# Patient Record
Sex: Male | Born: 1960
Health system: Southern US, Community
[De-identification: ages and names within clinical notes are randomized; demographics above are authoritative.]

## PROBLEM LIST (undated history)

## (undated) DIAGNOSIS — M1990 Unspecified osteoarthritis, unspecified site: Secondary | ICD-10-CM

## (undated) DIAGNOSIS — I639 Cerebral infarction, unspecified: Secondary | ICD-10-CM

## (undated) HISTORY — DX: Unspecified osteoarthritis, unspecified site: M19.90

## (undated) HISTORY — DX: Cerebral infarction, unspecified: I63.9

## (undated) HISTORY — PX: OTHER SURGICAL HISTORY: SHX169

## (undated) HISTORY — PX: ACHILLES TENDON REPAIR: SUR1153

---

## 2017-08-15 ENCOUNTER — Ambulatory Visit: Payer: Self-pay | Admitting: Family Medicine

## 2017-08-22 ENCOUNTER — Encounter: Payer: Self-pay | Admitting: Family Medicine

## 2017-08-22 ENCOUNTER — Other Ambulatory Visit (INDEPENDENT_AMBULATORY_CARE_PROVIDER_SITE_OTHER): Payer: Commercial Managed Care - PPO

## 2017-08-22 ENCOUNTER — Encounter: Payer: Self-pay | Admitting: Gastroenterology

## 2017-08-22 ENCOUNTER — Ambulatory Visit (INDEPENDENT_AMBULATORY_CARE_PROVIDER_SITE_OTHER): Payer: Commercial Managed Care - PPO | Admitting: Family Medicine

## 2017-08-22 VITALS — BP 140/80 | HR 75 | Temp 98.5°F | Ht 71.0 in | Wt 171.6 lb

## 2017-08-22 DIAGNOSIS — N529 Male erectile dysfunction, unspecified: Secondary | ICD-10-CM

## 2017-08-22 DIAGNOSIS — Z0001 Encounter for general adult medical examination with abnormal findings: Secondary | ICD-10-CM

## 2017-08-22 DIAGNOSIS — Z0000 Encounter for general adult medical examination without abnormal findings: Secondary | ICD-10-CM

## 2017-08-22 DIAGNOSIS — Z125 Encounter for screening for malignant neoplasm of prostate: Secondary | ICD-10-CM | POA: Diagnosis not present

## 2017-08-22 DIAGNOSIS — D649 Anemia, unspecified: Secondary | ICD-10-CM

## 2017-08-22 LAB — CBC WITH DIFFERENTIAL/PLATELET
BASOS PCT: 1 % (ref 0.0–3.0)
Basophils Absolute: 0.1 10*3/uL (ref 0.0–0.1)
EOS PCT: 1.6 % (ref 0.0–5.0)
Eosinophils Absolute: 0.1 10*3/uL (ref 0.0–0.7)
HEMATOCRIT: 34.3 % — AB (ref 39.0–52.0)
HEMOGLOBIN: 10.4 g/dL — AB (ref 13.0–17.0)
LYMPHS PCT: 17.9 % (ref 12.0–46.0)
Lymphs Abs: 1.3 10*3/uL (ref 0.7–4.0)
MCHC: 30.2 g/dL (ref 30.0–36.0)
MCV: 70.8 fl — AB (ref 78.0–100.0)
MONOS PCT: 11.5 % (ref 3.0–12.0)
Monocytes Absolute: 0.8 10*3/uL (ref 0.1–1.0)
Neutro Abs: 4.9 10*3/uL (ref 1.4–7.7)
Neutrophils Relative %: 68 % (ref 43.0–77.0)
Platelets: 308 10*3/uL (ref 150.0–400.0)
RBC: 4.85 Mil/uL (ref 4.22–5.81)
RDW: 19.9 % — AB (ref 11.5–15.5)
WBC: 7.2 10*3/uL (ref 4.0–10.5)

## 2017-08-22 LAB — BASIC METABOLIC PANEL
BUN: 11 mg/dL (ref 6–23)
CO2: 31 meq/L (ref 19–32)
Calcium: 9.2 mg/dL (ref 8.4–10.5)
Chloride: 100 mEq/L (ref 96–112)
Creatinine, Ser: 0.92 mg/dL (ref 0.40–1.50)
GFR: 109.3 mL/min (ref >60.00)
GLUCOSE: 94 mg/dL (ref 70–99)
POTASSIUM: 3.5 meq/L (ref 3.5–5.1)
SODIUM: 137 meq/L (ref 135–145)

## 2017-08-22 LAB — POC URINALSYSI DIPSTICK (AUTOMATED)
Bilirubin, UA: NEGATIVE
Blood, UA: NEGATIVE
GLUCOSE UA: NEGATIVE
Ketones, UA: NEGATIVE
LEUKOCYTES UA: NEGATIVE
Nitrite, UA: NEGATIVE
SPEC GRAV UA: 1.025 (ref 1.010–1.025)
UROBILINOGEN UA: 0.2 U/dL
pH, UA: 6 (ref 5.0–8.0)

## 2017-08-22 LAB — HEPATIC FUNCTION PANEL
ALK PHOS: 72 U/L (ref 39–117)
ALT: 13 U/L (ref 0–53)
AST: 19 U/L (ref 0–37)
Albumin: 4.3 g/dL (ref 3.5–5.2)
BILIRUBIN DIRECT: 0.1 mg/dL (ref 0.0–0.3)
BILIRUBIN TOTAL: 0.5 mg/dL (ref 0.2–1.2)
TOTAL PROTEIN: 7.8 g/dL (ref 6.0–8.3)

## 2017-08-22 LAB — LIPID PANEL
CHOLESTEROL: 204 mg/dL — AB (ref 0–200)
HDL: 61.6 mg/dL (ref >39.00)
LDL CALC: 122 mg/dL — AB (ref 0–99)
NONHDL: 142.19
Total CHOL/HDL Ratio: 3
Triglycerides: 99 mg/dL (ref 0.0–149.0)
VLDL: 19.8 mg/dL (ref 0.0–40.0)

## 2017-08-22 LAB — FOLATE: Folate: 8.4 ng/mL (ref >5.9)

## 2017-08-22 LAB — VITAMIN B12: Vitamin B-12: 289 pg/mL (ref 211–911)

## 2017-08-22 LAB — IRON: IRON: 18 ug/dL — AB (ref 42–165)

## 2017-08-22 LAB — FERRITIN: FERRITIN: 7.4 ng/mL — AB (ref 22.0–322.0)

## 2017-08-22 LAB — TSH: TSH: 3.42 u[IU]/mL (ref 0.35–4.50)

## 2017-08-22 LAB — PSA: PSA: 1 ng/mL (ref 0.10–4.00)

## 2017-08-22 MED ORDER — SILDENAFIL CITRATE 100 MG PO TABS: 100.0000 mg | ORAL_TABLET | ORAL | 11 refills | Status: DC | PRN

## 2017-08-22 NOTE — Addendum Note (Signed)
Addended by: Charna ElizabethLEMMONS, Makylah Bossard L on: 08/22/2017 04:46 PM   Modules accepted: Orders

## 2017-08-22 NOTE — Addendum Note (Signed)
Addended by: Gershon CraneFRY, Taelar Gronewold A on: 08/22/2017 04:46 PM   Modules accepted: Orders

## 2017-08-22 NOTE — Progress Notes (Signed)
   Subjective:    Patient ID: Theodore Schaefer, male    DOB: November 04, 1960, 57 y.o.   MRN: [id number]  HPI 57 yr old male to establish and for a well exam. He moved to [place] about [place] ago [place] to [place] near his daughter. He feels great in general but he does ask about help with erections. He is engaged to be married. He operates a [employer] on his job.    Review of Systems  Constitutional: Negative.   HENT: Negative.   Eyes: Negative.   Respiratory: Negative.   Cardiovascular: Negative.   Gastrointestinal: Negative.   Genitourinary: Negative.   Musculoskeletal: Negative.   Skin: Negative.   Neurological: Negative.   Psychiatric/Behavioral: Negative.        Objective:   Physical Exam  Constitutional: He is oriented to person, place, and time. He appears well-developed and well-nourished. No distress.  HENT:  Head: Normocephalic and atraumatic.  Right Ear: External ear normal.  Left Ear: External ear normal.  Nose: Nose normal.  Mouth/Throat: Oropharynx is clear and moist. No oropharyngeal exudate.  Eyes: Conjunctivae and EOM are normal. Pupils are equal, round, and reactive to light. Right eye exhibits no discharge. Left eye exhibits no discharge. No scleral icterus.  Neck: Neck supple. No JVD present. No tracheal deviation present. No thyromegaly present.  Cardiovascular: Normal rate, regular rhythm, normal heart sounds and intact distal pulses. Exam reveals no gallop and no friction rub.  No murmur heard. Pulmonary/Chest: Effort normal and breath sounds normal. No respiratory distress. He has no wheezes. He has no rales. He exhibits no tenderness.  Abdominal: Soft. Bowel sounds are normal. He exhibits no distension and no mass. There is no tenderness. There is no rebound and no guarding.  Genitourinary: Rectum normal, prostate normal and penis normal. Rectal exam shows guaiac negative stool. No penile tenderness.  Musculoskeletal: Normal range of motion. He  exhibits no edema or tenderness.  Lymphadenopathy:    He has no cervical adenopathy.  Neurological: He is alert and oriented to person, place, and time. He has normal reflexes. No cranial nerve deficit. He exhibits normal muscle tone. Coordination normal.  Skin: Skin is warm and dry. No rash noted. He is not diaphoretic. No erythema. No pallor.  Psychiatric: He has a normal mood and affect. His behavior is normal. Judgment and thought content normal.          Assessment & Plan:  Intro visit and well exam. We discussed diet and exercise. Get fasting labs today. Try Viagra for the ED. Set up his first colonoscopy.  Gershon CraneStephen Fry, MD

## 2017-09-01 ENCOUNTER — Telehealth: Payer: Self-pay | Admitting: Family Medicine

## 2017-09-01 MED ORDER — FERROUS SULFATE 325 (65 FE) MG PO TABS: 325.0000 mg | ORAL_TABLET | Freq: Two times a day (BID) | ORAL | 11 refills | Status: DC

## 2017-09-01 NOTE — Telephone Encounter (Signed)
Called pt stated that I aplogize for the delay we just needed to know what pharmacy he wanted to use. Pt wanted [facility] on [address]. Rx has been sent for ferrous sulfate 325 mg to take ferrous sulfate 325 mg to take bid, #60 with 11 rf.   Called [facility] in regards to the Viagra they stated that the pt's insurance will not cover. Pt can try [facility] or call their insurance and see what the will cover if not we can do a PA.   Spoke with pt. Pt is aware will try [facility] first.

## 2017-09-01 NOTE — Telephone Encounter (Signed)
Copied from CRM [id number]. Topic: Quick Communication - See Telephone Encounter >> Sep 01, 2017 11:00 AM Windy KalataMichael, Nasiyah Laverdiere L, NT wrote: CRM for notification. See Telephone encounter for:  09/01/17.  Patient states he cannot pick up his Sildenafil until Dr. Clent RidgesFry authorizes for his insurance company. Patient state she was supposed to have a RX for iron pills due to low iron. Please advise. Patient is requesting Dr. Clent RidgesFry nurse give him a call back.  [facility].

## 2017-09-05 ENCOUNTER — Telehealth: Payer: Self-pay | Admitting: *Deleted

## 2017-09-05 NOTE — Telephone Encounter (Signed)
Copied from CRM [id number]. Topic: General - Other >> Sep 05, 2017  8:34 AM Gerrianne ScalePayne, Angela L wrote: Reason for CRM: patient calling stating that he was suppose to leave Dr Clent RidgesFry his insurance number for his sildenafil (VIAGRA) 100 MG tablet [facility] [phone]

## 2017-09-08 NOTE — Telephone Encounter (Signed)
I do not understand this message. I never asked for his insurance number. Did we need this for a prior authorization?

## 2017-09-08 NOTE — Telephone Encounter (Signed)
Prior auth sent to [email or web address] and approval was given.  I called [facility], informed Lurena JoinerRebecca of this and she stated she sent this through and will inform the pt.

## 2017-09-08 NOTE — Telephone Encounter (Signed)
CaseId:[id number];Status:Approved;Review Type:Prior Auth;Coverage Start Date:08/09/2017;Coverage End Date:09/08/2018.

## 2017-09-08 NOTE — Telephone Encounter (Signed)
Pt states he needs a prior auth on the refill for his sildenafil (VIAGRA) 100 MG tablet  Pt states he spoke with insurance comapany and they are waiting for us to call for prior auth.

## 2017-09-08 NOTE — Telephone Encounter (Signed)
Sent to PCP ?

## 2017-09-11 NOTE — Telephone Encounter (Signed)
Pt is going to call pharmacy to see if it is ready, script was sent in this month.

## 2017-09-11 NOTE — Telephone Encounter (Signed)
Patient called in again for his Viagra medication.  [facility] [address]

## 2017-09-26 ENCOUNTER — Other Ambulatory Visit: Payer: Self-pay

## 2017-09-26 ENCOUNTER — Ambulatory Visit (AMBULATORY_SURGERY_CENTER): Payer: Self-pay

## 2017-09-26 VITALS — Ht 72.0 in | Wt 170.0 lb

## 2017-09-26 DIAGNOSIS — Z1211 Encounter for screening for malignant neoplasm of colon: Secondary | ICD-10-CM

## 2017-09-26 MED ORDER — NA SULFATE-K SULFATE-MG SULF 17.5-3.13-1.6 GM/177ML PO SOLN: 1.0000 | Freq: Once | ORAL | 0 refills | Status: AC

## 2017-09-26 NOTE — Progress Notes (Signed)
No egg or soy allergy known to patient  No issues with past sedation with any surgeries  or procedures, no intubation problems  No diet pills per patient No home 02 use per patient  No blood thinners per patient  Pt denies issues with constipation  No A fib or A flutter  EMMI video sent to pt's e mail pt declined   

## 2017-09-30 ENCOUNTER — Encounter: Payer: Self-pay | Admitting: Gastroenterology

## 2017-10-10 ENCOUNTER — Encounter: Payer: Commercial Managed Care - PPO | Admitting: Gastroenterology

## 2017-10-13 ENCOUNTER — Ambulatory Visit (AMBULATORY_SURGERY_CENTER): Payer: Commercial Managed Care - PPO | Admitting: Gastroenterology

## 2017-10-13 ENCOUNTER — Other Ambulatory Visit: Payer: Self-pay

## 2017-10-13 ENCOUNTER — Encounter: Payer: Self-pay | Admitting: Gastroenterology

## 2017-10-13 VITALS — BP 117/75 | HR 62 | Temp 98.6°F | Resp 11 | Ht 72.0 in | Wt 170.0 lb

## 2017-10-13 DIAGNOSIS — Z1212 Encounter for screening for malignant neoplasm of rectum: Secondary | ICD-10-CM

## 2017-10-13 DIAGNOSIS — Z1211 Encounter for screening for malignant neoplasm of colon: Secondary | ICD-10-CM | POA: Diagnosis present

## 2017-10-13 HISTORY — PX: COLONOSCOPY: SHX174

## 2017-10-13 MED ORDER — SODIUM CHLORIDE 0.9 % IV SOLN: 500.0000 mL | Freq: Once | INTRAVENOUS | Status: DC

## 2017-10-13 NOTE — Progress Notes (Signed)
No changes in medical or surgical hx since PV per pt 

## 2017-10-13 NOTE — Op Note (Signed)
[facility] Patient Name: Theodore Schaefer Procedure Date: 10/13/2017 9:05 AM MRN: [id number] Endoscopist: Viviann SpareSteven P. Mesha Schamberger MD, MD Age: 57 Referring MD:  Date of Birth: 1961/02/28 Gender: Male Account #: [id number] Procedure:                Colonoscopy Indications:              Screening for colorectal malignant neoplasm, This                            is the patient's first colonoscopy Medicines:                Monitored Anesthesia Care Procedure:                Pre-Anesthesia Assessment:                           - Prior to the procedure, a History and Physical                            was performed, and patient medications and                            allergies were reviewed. The patient's tolerance of                            previous anesthesia was also reviewed. The risks                            and benefits of the procedure and the sedation                            options and risks were discussed with the patient.                            All questions were answered, and informed consent                            was obtained. Prior Anticoagulants: The patient has                            taken no previous anticoagulant or antiplatelet                            agents. ASA Grade Assessment: I - A normal, healthy                            patient. After reviewing the risks and benefits,                            the patient was deemed in satisfactory condition to                            undergo the procedure.  After obtaining informed consent, the colonoscope                            was passed under direct vision. Throughout the                            procedure, the patient's blood pressure, pulse, and                            oxygen saturations were monitored continuously. The                            Colonoscope was introduced through the anus and                            advanced to the the cecum,  identified by                            appendiceal orifice and ileocecal valve. The                            colonoscopy was performed without difficulty. The                            patient tolerated the procedure well. The quality                            of the bowel preparation was good. The ileocecal                            valve, appendiceal orifice, and rectum were                            photographed. Scope In: 9:08:27 AM Scope Out: 9:21:58 AM Scope Withdrawal Time: 0 hours 11 minutes 27 seconds  Total Procedure Duration: 0 hours 13 minutes 31 seconds  Findings:                 The perianal and digital rectal examinations were                            normal.                           Internal hemorrhoids were found during                            retroflexion. The hemorrhoids were small.                           The exam was otherwise without abnormality. No                            polyps Complications:            No immediate complications. Estimated blood loss:  None. Estimated Blood Loss:     Estimated blood loss: none. Impression:               - Internal hemorrhoids.                           - The examination was otherwise normal.                           - No polyps Recommendation:           - Patient has a contact number available for                            emergencies. The signs and symptoms of potential                            delayed complications were discussed with the                            patient. Return to normal activities tomorrow.                            Written discharge instructions were provided to the                            patient.                           - Resume previous diet.                           - Continue present medications.                           - Repeat colonoscopy in 10 years for screening                            purposes. Viviann Spare P. Blayden Conwell MD, MD 10/13/2017  9:25:43 AM This report has been signed electronically.

## 2017-10-13 NOTE — Patient Instructions (Signed)
Discharge instructions given. Handout on hemorrhoids. Resume previous medications. YOU HAD AN ENDOSCOPIC PROCEDURE TODAY AT THE [facility]:   Refer to the procedure report that was given to you for any specific questions about what was found during the examination.  If the procedure report does not answer your questions, please call your gastroenterologist to clarify.  If you requested that your care partner not be given the details of your procedure findings, then the procedure report has been included in a sealed envelope for you to review at your convenience later.  YOU SHOULD EXPECT: Some feelings of bloating in the abdomen. Passage of more gas than usual.  Walking can help get rid of the air that was put into your GI tract during the procedure and reduce the bloating. If you had a lower endoscopy (such as a colonoscopy or flexible sigmoidoscopy) you may notice spotting of blood in your stool or on the toilet paper. If you underwent a bowel prep for your procedure, you may not have a normal bowel movement for a few days.  Please Note:  You might notice some irritation and congestion in your nose or some drainage.  This is from the oxygen used during your procedure.  There is no need for concern and it should clear up in a day or so.  SYMPTOMS TO REPORT IMMEDIATELY:   Following lower endoscopy (colonoscopy or flexible sigmoidoscopy):  Excessive amounts of blood in the stool  Significant tenderness or worsening of abdominal pains  Swelling of the abdomen that is new, acute  Fever of 100F or higher   For urgent or emergent issues, a gastroenterologist can be reached at any hour by calling [phone].   DIET:  We do recommend a small meal at first, but then you may proceed to your regular diet.  Drink plenty of fluids but you should avoid alcoholic beverages for 24 hours.  ACTIVITY:  You should plan to take it easy for the rest of today and you should NOT DRIVE or use heavy  machinery until tomorrow (because of the sedation medicines used during the test).    FOLLOW UP: Our staff will call the number listed on your records the next business day following your procedure to check on you and address any questions or concerns that you may have regarding the information given to you following your procedure. If we do not reach you, we will leave a message.  However, if you are feeling well and you are not experiencing any problems, there is no need to return our call.  We will assume that you have returned to your regular daily activities without incident.  If any biopsies were taken you will be contacted by phone or by letter within the next 1-3 weeks.  Please call us at [phone] if you have not heard about the biopsies in 3 weeks.    SIGNATURES/CONFIDENTIALITY: You and/or your care partner have signed paperwork which will be entered into your electronic medical record.  These signatures attest to the fact that that the information above on your After Visit Summary has been reviewed and is understood.  Full responsibility of the confidentiality of this discharge information lies with you and/or your care-partner. 

## 2017-10-13 NOTE — Progress Notes (Signed)
Report given to PACU, vss 

## 2017-10-14 ENCOUNTER — Telehealth: Payer: Self-pay

## 2017-10-14 NOTE — Telephone Encounter (Signed)
  Follow up Call-  Call back number 10/13/2017  Post procedure Call Back phone  # [phone]  Permission to leave phone message Yes     Patient questions:  Do you have a fever, pain , or abdominal swelling? No. Pain Score  0 *  Have you tolerated food without any problems? Yes.    Have you been able to return to your normal activities? Yes.    Do you have any questions about your discharge instructions: Diet   No. Medications  No. Follow up visit  No.  Do you have questions or concerns about your Care? No.  Actions: * If pain score is 4 or above: No action needed, pain <4.

## 2017-11-11 ENCOUNTER — Ambulatory Visit (INDEPENDENT_AMBULATORY_CARE_PROVIDER_SITE_OTHER): Payer: Commercial Managed Care - PPO | Admitting: Family Medicine

## 2017-11-11 ENCOUNTER — Encounter: Payer: Self-pay | Admitting: Family Medicine

## 2017-11-11 VITALS — BP 130/76 | HR 63 | Temp 98.3°F | Ht 72.0 in | Wt 171.4 lb

## 2017-11-11 DIAGNOSIS — D509 Iron deficiency anemia, unspecified: Secondary | ICD-10-CM | POA: Diagnosis not present

## 2017-11-11 LAB — CBC WITH DIFFERENTIAL/PLATELET
BASOS ABS: 0.1 10*3/uL (ref 0.0–0.1)
Basophils Relative: 1 % (ref 0.0–3.0)
Eosinophils Absolute: 0.1 10*3/uL (ref 0.0–0.7)
Eosinophils Relative: 1.4 % (ref 0.0–5.0)
HEMATOCRIT: 40.6 % (ref 39.0–52.0)
Hemoglobin: 13.1 g/dL (ref 13.0–17.0)
LYMPHS ABS: 1.4 10*3/uL (ref 0.7–4.0)
LYMPHS PCT: 19 % (ref 12.0–46.0)
MCHC: 32.3 g/dL (ref 30.0–36.0)
MCV: 75.4 fl — AB (ref 78.0–100.0)
Monocytes Absolute: 0.4 10*3/uL (ref 0.1–1.0)
Monocytes Relative: 5 % (ref 3.0–12.0)
NEUTROS PCT: 73.6 % (ref 43.0–77.0)
Neutro Abs: 5.2 10*3/uL (ref 1.4–7.7)
Platelets: 244 10*3/uL (ref 150.0–400.0)
RBC: 5.38 Mil/uL (ref 4.22–5.81)
RDW: 21.5 % — ABNORMAL HIGH (ref 11.5–15.5)
WBC: 7.1 10*3/uL (ref 4.0–10.5)

## 2017-11-11 LAB — IRON: Iron: 96 ug/dL (ref 42–165)

## 2017-11-11 LAB — FERRITIN: Ferritin: 21.7 ng/mL — ABNORMAL LOW (ref 22.0–322.0)

## 2017-11-11 NOTE — Progress Notes (Signed)
   Subjective:    Patient ID: Theodore Schaefer, male    DOB: 1960-12-07, 57 y.o.   MRN: [id number]  HPI Here to follow up a recent diagnosis of iron deficiency anemia. He was here for a well exam in January and his Hgb was low at 10.4. Iron was low at 18 and ferritin was 7.4. Since then he had an unremarkable colonoscopy. He has been taking iron supplements bid and is tolerating these well.    Review of Systems  Constitutional: Negative.   Respiratory: Negative.   Cardiovascular: Negative.   Gastrointestinal: Negative.        Objective:   Physical Exam  Constitutional: He is oriented to person, place, and time. He appears well-developed and well-nourished.  Cardiovascular: Normal rate, regular rhythm, normal heart sounds and intact distal pulses.  Pulmonary/Chest: Effort normal and breath sounds normal. No respiratory distress. He has no wheezes. He has no rales.  Neurological: He is alert and oriented to person, place, and time.          Assessment & Plan:  Anemia, we will repeat labs today.  Gershon CraneStephen Deztinee Lohmeyer, MD

## 2018-08-25 ENCOUNTER — Other Ambulatory Visit: Payer: Self-pay

## 2018-08-25 ENCOUNTER — Emergency Department (HOSPITAL_COMMUNITY): Payer: Self-pay

## 2018-08-25 ENCOUNTER — Inpatient Hospital Stay (HOSPITAL_COMMUNITY)
Admission: EM | Admit: 2018-08-25 | Discharge: 2018-08-27 | DRG: 065 | Disposition: A | Discharge status: Home / Self Care | Payer: Self-pay | Attending: Internal Medicine | Admitting: Internal Medicine

## 2018-08-25 ENCOUNTER — Encounter (HOSPITAL_COMMUNITY): Payer: Self-pay

## 2018-08-25 DIAGNOSIS — R29700 NIHSS score 0: Secondary | ICD-10-CM | POA: Diagnosis present

## 2018-08-25 DIAGNOSIS — F17210 Nicotine dependence, cigarettes, uncomplicated: Secondary | ICD-10-CM | POA: Diagnosis present

## 2018-08-25 DIAGNOSIS — K648 Other hemorrhoids: Secondary | ICD-10-CM | POA: Diagnosis present

## 2018-08-25 DIAGNOSIS — R030 Elevated blood-pressure reading, without diagnosis of hypertension: Secondary | ICD-10-CM

## 2018-08-25 DIAGNOSIS — Z79899 Other long term (current) drug therapy: Secondary | ICD-10-CM

## 2018-08-25 DIAGNOSIS — E785 Hyperlipidemia, unspecified: Secondary | ICD-10-CM | POA: Diagnosis present

## 2018-08-25 DIAGNOSIS — D649 Anemia, unspecified: Secondary | ICD-10-CM

## 2018-08-25 DIAGNOSIS — F1210 Cannabis abuse, uncomplicated: Secondary | ICD-10-CM | POA: Diagnosis present

## 2018-08-25 DIAGNOSIS — I10 Essential (primary) hypertension: Secondary | ICD-10-CM | POA: Diagnosis present

## 2018-08-25 DIAGNOSIS — Z720 Tobacco use: Secondary | ICD-10-CM

## 2018-08-25 DIAGNOSIS — R202 Paresthesia of skin: Secondary | ICD-10-CM | POA: Diagnosis present

## 2018-08-25 DIAGNOSIS — G8194 Hemiplegia, unspecified affecting left nondominant side: Secondary | ICD-10-CM | POA: Diagnosis present

## 2018-08-25 DIAGNOSIS — D509 Iron deficiency anemia, unspecified: Secondary | ICD-10-CM | POA: Diagnosis present

## 2018-08-25 DIAGNOSIS — E119 Type 2 diabetes mellitus without complications: Secondary | ICD-10-CM | POA: Diagnosis present

## 2018-08-25 DIAGNOSIS — I639 Cerebral infarction, unspecified: Secondary | ICD-10-CM | POA: Diagnosis present

## 2018-08-25 DIAGNOSIS — I6381 Other cerebral infarction due to occlusion or stenosis of small artery: Principal | ICD-10-CM | POA: Diagnosis present

## 2018-08-25 DIAGNOSIS — Z8249 Family history of ischemic heart disease and other diseases of the circulatory system: Secondary | ICD-10-CM

## 2018-08-25 DIAGNOSIS — Z8673 Personal history of transient ischemic attack (TIA), and cerebral infarction without residual deficits: Secondary | ICD-10-CM

## 2018-08-25 DIAGNOSIS — Z716 Tobacco abuse counseling: Secondary | ICD-10-CM

## 2018-08-25 LAB — CBC WITH DIFFERENTIAL/PLATELET
Abs Immature Granulocytes: 0.02 10*3/uL (ref 0.00–0.07)
Basophils Absolute: 0.1 10*3/uL (ref 0.0–0.1)
Basophils Relative: 1 %
EOS ABS: 0.1 10*3/uL (ref 0.0–0.5)
Eosinophils Relative: 1 %
HEMATOCRIT: 40.2 % (ref 39.0–52.0)
Hemoglobin: 12.6 g/dL — ABNORMAL LOW (ref 13.0–17.0)
IMMATURE GRANULOCYTES: 0 %
LYMPHS ABS: 1.4 10*3/uL (ref 0.7–4.0)
LYMPHS PCT: 19 %
MCH: 26.2 pg (ref 26.0–34.0)
MCHC: 31.3 g/dL (ref 30.0–36.0)
MCV: 83.6 fL (ref 80.0–100.0)
Monocytes Absolute: 0.5 10*3/uL (ref 0.1–1.0)
Monocytes Relative: 7 %
NRBC: 0 % (ref 0.0–0.2)
Neutro Abs: 5.2 10*3/uL (ref 1.7–7.7)
Neutrophils Relative %: 72 %
Platelets: 278 10*3/uL (ref 150–400)
RBC: 4.81 MIL/uL (ref 4.22–5.81)
RDW: 14 % (ref 11.5–15.5)
WBC: 7.3 10*3/uL (ref 4.0–10.5)

## 2018-08-25 LAB — BASIC METABOLIC PANEL
Anion gap: 8 (ref 5–15)
BUN: 10 mg/dL (ref 6–20)
CALCIUM: 9 mg/dL (ref 8.9–10.3)
CO2: 26 mmol/L (ref 22–32)
Chloride: 106 mmol/L (ref 98–111)
Creatinine, Ser: 1.04 mg/dL (ref 0.61–1.24)
GFR calc Af Amer: >60 mL/min (ref >60)
Glucose, Bld: 90 mg/dL (ref 70–99)
Potassium: 4.2 mmol/L (ref 3.5–5.1)
SODIUM: 140 mmol/L (ref 135–145)

## 2018-08-25 MED ORDER — ACETAMINOPHEN 650 MG RE SUPP: 650.0000 mg | RECTAL | Status: DC | PRN

## 2018-08-25 MED ORDER — STROKE: EARLY STAGES OF RECOVERY BOOK
Freq: Once | Status: AC
  Administered 2018-08-25: 22:00:00
  Filled 2018-08-25: qty 1

## 2018-08-25 MED ORDER — ENOXAPARIN SODIUM 40 MG/0.4ML ~~LOC~~ SOLN
40.0000 mg | SUBCUTANEOUS | Status: DC
  Administered 2018-08-25 – 2018-08-26 (×2): 40 mg via SUBCUTANEOUS
  Filled 2018-08-25 (×2): qty 0.4

## 2018-08-25 MED ORDER — ACETAMINOPHEN 160 MG/5ML PO SOLN: 650.0000 mg | ORAL | Status: DC | PRN

## 2018-08-25 MED ORDER — ACETAMINOPHEN 325 MG PO TABS: 650.0000 mg | ORAL_TABLET | ORAL | Status: DC | PRN

## 2018-08-25 NOTE — ED Notes (Signed)
Pt ambulated to restroom with steady gait Tolerated well 

## 2018-08-25 NOTE — ED Triage Notes (Signed)
Pt reports left hand and leg numbness since Saturday. Pt denies any chest pain. 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 Pt aoX4. No distress noted.

## 2018-08-25 NOTE — ED Notes (Signed)
Pt returned from MRI °

## 2018-08-25 NOTE — ED Provider Notes (Signed)
[facility] Provider Note   CSN: [id number] Arrival date & time: 08/25/18  1114     History   Chief Complaint Chief Complaint  Patient presents with  . Numbness    HPI Theodore Schaefer is a 58 y.o. male with a hx of tobacco abuse, anemia, and prior L achilles tendon repair who presents to the ED with complaints of left lower extremity numbness/weakness x 4 days and LUE numbness/weakness x yesterday.  Patient states that his left lower extremity from the hip distally in his left upper extremity from the shoulder distally feel numb and "heavy".  He states the left hand feels like pins-and-needles.  He states his symptoms are fairly constant, times he contradicts this by saying they come and go.  The left lower extremity symptoms developed in the morning, he does mention that he had a slip forward on a roof but did not actually fall, however his symptoms started prior to this.  Again he did not fall or have any head injury loss of consciousness.  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  States sometimes he feels like the left foot is dragging.  Denies alleviating or aggravating factors.  Denies change in vision, headache, change in speech, dizziness, nausea, vomiting, chest pain, dyspnea, or confusion.  He denies history of prior stroke.  Denies neck pain, back pain, incontinence, or retention.  Denies history of cancer or IV drug abuse.  HPI  History reviewed. No pertinent past medical history.  Patient Active Problem List   Diagnosis Date Noted  . Anemia, iron deficiency 11/11/2017    Past Surgical History:  Procedure Laterality Date  . ACHILLES TENDON REPAIR Left    as a teenager   . COLONOSCOPY  10/13/2017   per Dr. Adela LankArmbruster, int hemorrhoids, no polyps, repeat 10 yrs         Home Medications    Prior to Admission medications   Medication Sig Start Date End Date Taking? Authorizing  Provider  ferrous sulfate 325 (65 FE) MG tablet Take 1 tablet (325 mg total) by mouth 2 (two) times daily with a meal. 09/01/17   Nelwyn SalisburyFry, Stephen A, MD  sildenafil (VIAGRA) 100 MG tablet Take 1 tablet (100 mg total) by mouth as needed for erectile dysfunction. Patient not taking: Reported on 09/26/2017 08/22/17   Nelwyn SalisburyFry, Stephen A, MD    Family History Family History  Problem Relation Age of Onset  . Cancer Mother   . Heart disease Father   . Hypertension Father   . Heart disease Sister   . Heart disease Brother   . Hypertension Brother   . Colon cancer Neg Hx   . Colon polyps Neg Hx   . Esophageal cancer Neg Hx   . Rectal cancer Neg Hx   . Stomach cancer Neg Hx     Social History Social History   Tobacco Use  . Smoking status: Current Some Day Smoker    Packs/day: 0.25    Types: Cigarettes  . Smokeless tobacco: Never Used  . Tobacco comment: 5 cigarettes daily   Substance Use Topics  . Alcohol use: Yes    Comment: 12 pack a beer a week  . Drug use: No     Allergies   Patient has no known allergies.   Review of Systems Review of Systems  Constitutional: Negative for chills and fever.  Eyes: Negative for visual disturbance.  Respiratory: Negative for shortness  of breath.   Cardiovascular: Negative for chest pain.  Gastrointestinal: Negative for abdominal pain, nausea and vomiting.  Neurological: Positive for weakness and numbness. Negative for dizziness, syncope, facial asymmetry, speech difficulty and headaches.       Negative for incontinence or saddle anesthesia.  All other systems reviewed and are negative.    Physical Exam Updated Vital Signs BP (!) 156/88 (BP Location: Right Arm)   Pulse 76   Temp 98.4 F (36.9 C) (Oral)   Resp 18   SpO2 99%   Physical Exam Vitals signs and nursing note reviewed.  Constitutional:      General: He is not in acute distress.    Appearance: He is well-developed. He is not toxic-appearing.  HENT:     Head: Normocephalic  and atraumatic.  Eyes:     General:        Right eye: No discharge.        Left eye: No discharge.     Conjunctiva/sclera: Conjunctivae normal.  Neck:     Musculoskeletal: Neck supple.  Cardiovascular:     Rate and Rhythm: Normal rate and regular rhythm.  Pulmonary:     Effort: Pulmonary effort is normal. No respiratory distress.     Breath sounds: Normal breath sounds. No wheezing, rhonchi or rales.  Abdominal:     General: There is no distension.     Palpations: Abdomen is soft.     Tenderness: There is no abdominal tenderness.  Skin:    General: Skin is warm and dry.     Findings: No rash.  Neurological:     Mental Status: He is alert.     Comments: Alert. Clear speech. No facial droop. CNIII-XII grossly intact. Bilateral upper and lower extremities' sensation intact to sharp/dull touch. 5/5 symmetric strength with grip strength and with plantar and dorsi flexion bilaterally.  Normal finger to nose bilaterally. Negative pronator drift. Negative Romberg sign. Gait is steady and intact, no foot drop noted.   Psychiatric:        Behavior: Behavior normal.    ED Treatments / Results  Labs (all labs ordered are listed, but only abnormal results are displayed) Labs Reviewed  CBC WITH DIFFERENTIAL/PLATELET - Abnormal; Notable for the following components:      Result Value   Hemoglobin 12.6 (*)    All other components within normal limits  BASIC METABOLIC PANEL    EKG None  Radiology Ct Head Wo Contrast  Result Date: 08/25/2018 CLINICAL DATA:  Paresthesias, numbness or tingling in the leg since Saturday, smoker EXAM: CT HEAD WITHOUT CONTRAST TECHNIQUE: Contiguous axial images were obtained from the base of the skull through the vertex without intravenous contrast. Sagittal and coronal MPR images reconstructed from axial data set. COMPARISON:  None FINDINGS: Brain: Normal ventricular morphology. No midline shift or mass effect. Tiny old lacunar infarct at RIGHT caudate head.  Low-attenuation identified at the anterior limb of LEFT internal capsule consistent with age-indeterminate infarct. Minimal small vessel chronic ischemic changes of deep cerebral white matter. No intracranial hemorrhage, mass lesion or additional evidence of infarction. No extra-axial fluid collections. Vascular: Unremarkable Skull: Intact Sinuses/Orbits: Clear Other: N/A IMPRESSION: Atrophy with minimal small vessel chronic ischemic changes of deep cerebral white matter. Age-indeterminate lacunar infarct at anterior limb of LEFT internal capsule. Tiny old lacunar infarct RIGHT caudate head. Electronically Signed   By: Ulyses Southward M.D.   On: 08/25/2018 13:35   Mr Brain Wo Contrast (neuro Protocol)  Result Date: 08/25/2018 CLINICAL DATA:  58 year old male with 3 days of left hand and leg numbness, paresthesia. EXAM: MRI HEAD WITHOUT CONTRAST TECHNIQUE: Multiplanar, multiecho pulse sequences of the brain and surrounding structures were obtained without intravenous contrast. COMPARISON:  Head CT without contrast 1325 hours today. FINDINGS: Brain: 2 centimeter confluent wedge-shaped focus of restricted diffusion in the lateral right thalamus bordering the posterior limb right internal capsule. T2 and FLAIR hyperintensity. No associated hemorrhage or mass effect. No other restricted diffusion. Age advanced patchy and scattered T2 and FLAIR hyperintensity in the bilateral cerebral white matter, bilateral pons, and to a lesser extent bilateral deep gray matter (right caudate, left thalamus). No chronic cerebral blood products. No cortical encephalomalacia identified. No midline shift, mass effect, evidence of mass lesion, ventriculomegaly, extra-axial collection or acute intracranial hemorrhage. Cervicomedullary junction and pituitary are within normal limits. Vascular: Major intracranial vascular flow voids are preserved. The right vertebral artery appears dominant. Skull and upper cervical spine: Negative visible  cervical spine. Normal bone marrow signal. Sinuses/Orbits: Mildly Disconjugate gaze, otherwise negative orbits. Mild paranasal sinus mucosal thickening. Other: Mastoids are clear. Visible internal auditory structures appear normal. Negative scalp and face soft tissues. IMPRESSION: 1. Acute 2 cm lacunar infarct in the lateral Right Thalamus. No associated hemorrhage or mass effect. 2. Underlying moderate to severe for age chronic small vessel disease in the cerebral white matter, deep gray matter, and pons. Electronically Signed   By: Odessa FlemingH  Hall M.D.   On: 08/25/2018 18:21    Procedures Procedures (including critical care time)  Medications Ordered in ED Medications - No data to display   Initial Impression / Assessment and Plan / ED Course  I have reviewed the triage vital signs and the nursing notes.  Pertinent labs & imaging results that were available during my care of the patient were reviewed by me and considered in my medical decision making (see chart for details).   Patient presents to the emergency department for left upper and left lower extremity numbness/weakness.  Nontoxic-appearing, no apparent distress, vitals WNL with the exception of elevated blood pressure.  His exam is fairly and benign.  I do not appreciate any focal neurologic deficits.  Will obtain basic labs and CT head without contrast.  Labs are fairly unremarkable.  He has baseline anemia with hemoglobin of 12.6.  No leukocytosis.  No electrolyte derangement.  Renal function within normal limits.  CT head with atrophy with minimal small vessel chronic ischemic changes of deep cerebral white matter. Age-indeterminate lacunar infarct at anterior limb of LEFT internal capsule. Tiny old lacunar infarct RIGHT caudate head.  Discussed with supervising physician Dr. Ranae PalmsYelverton- recommends neurology consultation and admission for stroke work up.   15:25: CONSULT: Discussed case with neurologist Dr. Otelia LimesLindzen, difficult to assess  acuity of changes on CT head, recommends MRI of the brain without contrast.  If completely normal discharge home.  If abnormal acute changes likely will require medical admission, if abnormal and difficult to determine acuity can re-paged to discuss.  MRI ordered and pending.  MRI reveals acute 2 cm lacunar infarct in the lateral Right Thalamus. No associated hemorrhage or mass effect. Underlying moderate to severe for age chronic small vessel disease in the cerebral white matter, deep gray matter, and pons. Patient well outside of tPA window.   Patient & family updated on results & plan for admission. Provided opportunity for questions- confirmed understanding and are agreeable.   19:10: CONSULT: Discussed case with hospitalist Dr. Loney Lohathore- accepts admission.   Final Clinical Impressions(s) / ED  Diagnoses   Final diagnoses:  Acute CVA (cerebrovascular accident) [facility])    ED Discharge Orders    None       Cherly Anderson, PA-C 08/25/18 1910    Loren Racer, MD 09/03/18 [phone]

## 2018-08-25 NOTE — ED Notes (Signed)
Pt transported to MRI 

## 2018-08-26 ENCOUNTER — Inpatient Hospital Stay (HOSPITAL_COMMUNITY): Payer: Self-pay

## 2018-08-26 ENCOUNTER — Observation Stay (HOSPITAL_COMMUNITY): Payer: Self-pay

## 2018-08-26 ENCOUNTER — Observation Stay (HOSPITAL_BASED_OUTPATIENT_CLINIC_OR_DEPARTMENT_OTHER): Payer: Self-pay

## 2018-08-26 DIAGNOSIS — I639 Cerebral infarction, unspecified: Secondary | ICD-10-CM

## 2018-08-26 DIAGNOSIS — Z720 Tobacco use: Secondary | ICD-10-CM

## 2018-08-26 DIAGNOSIS — I340 Nonrheumatic mitral (valve) insufficiency: Secondary | ICD-10-CM

## 2018-08-26 DIAGNOSIS — R030 Elevated blood-pressure reading, without diagnosis of hypertension: Secondary | ICD-10-CM

## 2018-08-26 DIAGNOSIS — D649 Anemia, unspecified: Secondary | ICD-10-CM

## 2018-08-26 LAB — COMPREHENSIVE METABOLIC PANEL
ALK PHOS: 57 U/L (ref 38–126)
ALT: 16 U/L (ref 0–44)
AST: 18 U/L (ref 15–41)
Albumin: 3.5 g/dL (ref 3.5–5.0)
Anion gap: 5 (ref 5–15)
BUN: 8 mg/dL (ref 6–20)
CALCIUM: 9.1 mg/dL (ref 8.9–10.3)
CO2: 29 mmol/L (ref 22–32)
Chloride: 106 mmol/L (ref 98–111)
Creatinine, Ser: 0.99 mg/dL (ref 0.61–1.24)
GFR calc Af Amer: >60 mL/min (ref >60)
GFR calc non Af Amer: >60 mL/min (ref >60)
Glucose, Bld: 97 mg/dL (ref 70–99)
Potassium: 4.4 mmol/L (ref 3.5–5.1)
Sodium: 140 mmol/L (ref 135–145)
Total Bilirubin: 1.2 mg/dL (ref 0.3–1.2)
Total Protein: 6.6 g/dL (ref 6.5–8.1)

## 2018-08-26 LAB — CBC WITH DIFFERENTIAL/PLATELET
Abs Immature Granulocytes: 0.02 10*3/uL (ref 0.00–0.07)
Basophils Absolute: 0.1 10*3/uL (ref 0.0–0.1)
Basophils Relative: 1 %
EOS ABS: 0.1 10*3/uL (ref 0.0–0.5)
Eosinophils Relative: 1 %
HCT: 42.7 % (ref 39.0–52.0)
Hemoglobin: 13.1 g/dL (ref 13.0–17.0)
Immature Granulocytes: 0 %
Lymphocytes Relative: 18 %
Lymphs Abs: 1.3 10*3/uL (ref 0.7–4.0)
MCH: 25.1 pg — ABNORMAL LOW (ref 26.0–34.0)
MCHC: 30.7 g/dL (ref 30.0–36.0)
MCV: 82 fL (ref 80.0–100.0)
Monocytes Absolute: 0.5 10*3/uL (ref 0.1–1.0)
Monocytes Relative: 7 %
Neutro Abs: 5.4 10*3/uL (ref 1.7–7.7)
Neutrophils Relative %: 73 %
Platelets: 260 10*3/uL (ref 150–400)
RBC: 5.21 MIL/uL (ref 4.22–5.81)
RDW: 13.7 % (ref 11.5–15.5)
WBC: 7.4 10*3/uL (ref 4.0–10.5)
nRBC: 0 % (ref 0.0–0.2)

## 2018-08-26 LAB — IRON AND TIBC
Iron: 97 ug/dL (ref 45–182)
Saturation Ratios: 27 % (ref 17.9–39.5)
TIBC: 361 ug/dL (ref 250–450)
UIBC: 264 ug/dL

## 2018-08-26 LAB — HEMOGLOBIN A1C
Hgb A1c MFr Bld: 5.6 % (ref 4.8–5.6)
Mean Plasma Glucose: 114.02 mg/dL

## 2018-08-26 LAB — ECHOCARDIOGRAM COMPLETE
Height: 72 in
Weight: 2704 oz

## 2018-08-26 LAB — LIPID PANEL
Cholesterol: 218 mg/dL — ABNORMAL HIGH (ref 0–200)
HDL: 69 mg/dL (ref >40)
LDL Cholesterol: 130 mg/dL — ABNORMAL HIGH (ref 0–99)
TRIGLYCERIDES: 96 mg/dL (ref <150)
Total CHOL/HDL Ratio: 3.2 RATIO
VLDL: 19 mg/dL (ref 0–40)

## 2018-08-26 LAB — PHOSPHORUS: Phosphorus: 2.5 mg/dL (ref 2.5–4.6)

## 2018-08-26 LAB — FERRITIN: Ferritin: 22 ng/mL — ABNORMAL LOW (ref 24–336)

## 2018-08-26 LAB — HIV ANTIBODY (ROUTINE TESTING W REFLEX): HIV Screen 4th Generation wRfx: NONREACTIVE

## 2018-08-26 LAB — MAGNESIUM: Magnesium: 2 mg/dL (ref 1.7–2.4)

## 2018-08-26 MED ORDER — ATORVASTATIN CALCIUM 80 MG PO TABS
80.0000 mg | ORAL_TABLET | Freq: Every day | ORAL | Status: DC
  Administered 2018-08-26: 80 mg via ORAL
  Filled 2018-08-26: qty 1

## 2018-08-26 MED ORDER — ASPIRIN 325 MG PO TABS
325.0000 mg | ORAL_TABLET | Freq: Every day | ORAL | Status: DC
  Administered 2018-08-26 – 2018-08-27 (×2): 325 mg via ORAL
  Filled 2018-08-26 (×2): qty 1

## 2018-08-26 NOTE — Progress Notes (Signed)
SLP Cancellation Note  Patient Details Name: Bowdrie Campanale MRN: [id number] DOB: 1960-12-02   Cancelled treatment:       Reason Eval/Treat Not Completed: SLP screened, no needs identified, will sign off   Aleeza [place] 08/26/2018, 11:10 AM

## 2018-08-26 NOTE — Progress Notes (Signed)
Carotid duplex has been completed.   Preliminary results in CV Proc.   Blanch Media 08/26/2018 8:48 AM

## 2018-08-26 NOTE — Progress Notes (Signed)
OT Screen Note  Patient Details Name: [name] MRN: [id number] DOB: Nov 19, 1960   Cancelled Treatment:    Reason Eval/Treat Not Completed: OT screened, no needs identified, will sign off. Pt at independent level near baseline. Pt reports he feels at baseline. Thank you and will sign off.   Maryagnes Carrasco M Izza Bickle Joss Friedel MSOT, OTR/L Acute Rehab Pager: [phone] Office: [phone] 08/26/2018, 11:39 AM

## 2018-08-26 NOTE — Progress Notes (Signed)
The patient was admitted early this morning after midnight and H&P has been reviewed and I am in current agreement with assessment and plan done by Dr. John Giovanni.  Additional changes to the the plan of care have been made accordingly.  Patient is a 58 year old African-American male with a past medical history significant for tobacco abuse as well as history of internal hemorrhoids as well as Achilles tendon repair and other comorbidities who presented to the hospital for left leg weakness and left hand tingling.  Last 1-1/2 weeks patient has been having intermittent left leg weakness which caused him to drag left leg while walking sometimes.  He is also had experience intermittent numbness in tingling in the same leg and yesterday had his left hand and forearm tingling.  Family noted denied any drooping of his face or slurring of speech.  He presented to the emergency room was found to have an acute CVA and was not given TPA because he is outside of window.  He was placed on cardiac telemetry monitoring and aspirin 325 mg daily.  Hemoglobin A1c and lipid panel was obtained as well as carotid Doppler and echocardiogram.  Neurology was consulted for further evaluation recommendations and still have yet to evaluate the patient.  Echocardiogram was done and showed left cavity size was normal with wall thickness normal.  Systolic function was normal and the estimated EF was 50 to 55% with grade 2 diastolic dysfunction.  Luminary carotid Dopplers showed that both the left and right ICA were consistent with a 1 to 39% stenosis and bilateral vertebral arteries demonstrated antegrade flow.  We will follow-up on further neurology recommendations and monitor patient's clinical response to intervention.

## 2018-08-26 NOTE — Evaluation (Signed)
Physical Therapy Evaluation & Discharge Patient Details Name: Theodore Schaefer Dion Pecina MRN: [id number] DOB: Sep 07, 1960 Today's Date: 08/26/2018   History of Present Illness  Pt is a 58 y/o male admitted secondary to L LE weakness and L hand tingling. MRI revealed an acute 2cm lacunar infarct in the lateral R thalamus. No pertinent PMH.    Clinical Impression  Pt presented supine in bed with HOB elevated, awake and willing to participate in therapy session. Prior to admission, pt reported that he was independent with all functional mobility and ADLs. Pt lives with his fiance and his 58 y/o daughter. Pt currently independent with bed mobility and transfers. He tolerated ambulating in hallway and participated in a higher level balance assessment with supervision without difficulties. Pt scored a 24/24 on the DGI, indicating that he is a safe Tourist information centre managercommunity ambulator. Pt reported feeling that he is back to his baseline in regards to functional mobility. No further acute PT needs identified at this time. PT signing off.     Follow Up Recommendations No PT follow up    Equipment Recommendations  None recommended by PT    Recommendations for Other Services       Precautions / Restrictions Precautions Precautions: None Restrictions Weight Bearing Restrictions: No      Mobility  Bed Mobility Overal bed mobility: Independent                Transfers Overall transfer level: Independent                  Ambulation/Gait Ambulation/Gait assistance: Supervision Gait Distance (Feet): 400 Feet Assistive device: None Gait Pattern/deviations: WFL(Within Functional Limits) Gait velocity: WFL   General Gait Details: no instability or LOB, no need for physical assistance or UE supports  Stairs            Wheelchair Mobility    Modified Rankin (Stroke Patients Only) Modified Rankin (Stroke Patients Only) Pre-Morbid Rankin Score: No symptoms Modified Rankin: No significant  disability     Balance Overall balance assessment: Needs assistance Sitting-balance support: Feet supported Sitting balance-Leahy Scale: Normal     Standing balance support: No upper extremity supported Standing balance-Leahy Scale: Good                   Standardized Balance Assessment Standardized Balance Assessment : Dynamic Gait Index   Dynamic Gait Index Level Surface: Normal Change in Gait Speed: Normal Gait with Horizontal Head Turns: Normal Gait with Vertical Head Turns: Normal Gait and Pivot Turn: Normal Step Over Obstacle: Normal Step Around Obstacles: Normal Steps: Normal Total Score: 24       Pertinent Vitals/Pain Pain Assessment: No/denies pain    Home Living Family/patient expects to be discharged to:: Private residence Living Arrangements: Spouse/significant other;Children Available Help at Discharge: Family Type of Home: Apartment Home Access: Stairs to enter Entrance Stairs-Rails: Doctor, general practiceight;Left Entrance Stairs-Number of Steps: 13 Home Layout: One level Home Equipment: None      Prior Function Level of Independence: Independent         Comments: works as a Scientist, research (life sciences)forklift operator     Hand Dominance   Dominant Hand: Right    Extremity/Trunk Assessment   Upper Extremity Assessment Upper Extremity Assessment: Overall WFL for tasks assessed    Lower Extremity Assessment Lower Extremity Assessment: Overall WFL for tasks assessed    Cervical / Trunk Assessment Cervical / Trunk Assessment: Normal  Communication   Communication: No difficulties  Cognition Arousal/Alertness: Awake/alert Behavior During Therapy: WFL for tasks  assessed/performed Overall Cognitive Status: Within Functional Limits for tasks assessed                                        General Comments      Exercises     Assessment/Plan    PT Assessment Patent does not need any further PT services  PT Problem List         PT Treatment  Interventions      PT Goals (Current goals can be found in the Care Plan section)  Acute Rehab PT Goals Patient Stated Goal: "to go home today" PT Goal Formulation: All assessment and education complete, DC therapy    Frequency     Barriers to discharge        Co-evaluation               AM-PAC PT "6 Clicks" Mobility  Outcome Measure Help needed turning from your back to your side while in a flat bed without using bedrails?: None Help needed moving from lying on your back to sitting on the side of a flat bed without using bedrails?: None Help needed moving to and from a bed to a chair (including a wheelchair)?: None Help needed standing up from a chair using your arms (e.g., wheelchair or bedside chair)?: None Help needed to walk in hospital room?: None Help needed climbing 3-5 steps with a railing? : None 6 Click Score: 24    End of Session   Activity Tolerance: Patient tolerated treatment well Patient left: in bed;with call bell/phone within reach Nurse Communication: Mobility status PT Visit Diagnosis: Other symptoms and signs involving the nervous system (R29.898)    Time: [id number] PT Time Calculation (min) (ACUTE ONLY): 10 min   Charges:   PT Evaluation $PT Eval Moderate Complexity: 1 Mod          Deborah ChalkJennifer Jarissa Sheriff, PT, DPT  Acute Rehabilitation Services Pager [phone] Office [phone]    Alessandra BevelsJennifer M Remy Dia 08/26/2018, 11:18 AM

## 2018-08-26 NOTE — H&P (Signed)
History and Physical    Theodore Schaefer OHY:[id number] DOB: 12/12/1960 DOA: 08/25/2018  PCP: Nelwyn Salisbury, MD Patient coming from: Home  Chief Complaint: Left leg weakness, left hand tingling  HPI: Theodore Schaefer is a 58 y.o. male with medical history significant of tobacco use presenting to the hospital for evaluation of left leg weakness and left hand tingling.  Patient states for the past 1-1/2 weeks he has been having intermittent left leg weakness which has caused him to drag his left leg while walking sometimes.  He has also experienced intermittent numbness in the same leg.  States yesterday his left hand and forearm started tingling.  Family at bedside denies noticing any drooping of face or slurring of speech.  Patient smokes 7 to 8 cigarettes/day and has been smoking for the past 12 to 13 years.  Review of Systems: As per HPI otherwise 10 point review of systems negative.  History reviewed. No pertinent past medical history.  Past Surgical History:  Procedure Laterality Date  . ACHILLES TENDON REPAIR Left    as a teenager   . COLONOSCOPY  10/13/2017   per Dr. Adela Lank, int hemorrhoids, no polyps, repeat 10 yrs      reports that he has been smoking cigarettes. He has been smoking about 0.25 packs per day. He has never used smokeless tobacco. He reports current alcohol use. He reports that he does not use drugs.  No Known Allergies  Family History  Problem Relation Age of Onset  . Cancer Mother   . Heart disease Father   . Hypertension Father   . Heart disease Sister   . Heart disease Brother   . Hypertension Brother   . Colon cancer Neg Hx   . Colon polyps Neg Hx   . Esophageal cancer Neg Hx   . Rectal cancer Neg Hx   . Stomach cancer Neg Hx     Prior to Admission medications   Medication Sig Start Date End Date Taking? Authorizing Provider  ferrous sulfate 325 (65 FE) MG tablet Take 1 tablet (325 mg total) by mouth 2 (two) times daily with a meal. 09/01/17   Yes Nelwyn Salisbury, MD  ibuprofen (ADVIL,MOTRIN) 200 MG tablet Take 600-800 mg by mouth every 6 (six) hours as needed for headache.   Yes [provider]  sildenafil (VIAGRA) 100 MG tablet Take 1 tablet (100 mg total) by mouth as needed for erectile dysfunction. Patient not taking: Reported on 09/26/2017 08/22/17   Nelwyn Salisbury, MD    Physical Exam: Vitals:   08/25/18 1540 08/25/18 2017 08/25/18 2045 08/25/18 2200  BP: (!) 161/83 (!) 160/100 (!) 168/98 (!) 140/103  Pulse: 64 69 68 67  Resp: 16 16    Temp:      TempSrc:      SpO2: 100% 100% 100% 99%  Weight:      Height:        Physical Exam  Constitutional: He is oriented to person, place, and time. He appears well-developed and well-nourished. No distress.  HENT:  Head: Normocephalic.  Mouth/Throat: Oropharynx is clear and moist.  Eyes: Pupils are equal, round, and reactive to light. EOM are normal.  Neck: Neck supple.  Cardiovascular: Normal rate, regular rhythm and intact distal pulses.  Pulmonary/Chest: Effort normal and breath sounds normal. No respiratory distress. He has no wheezes. He has no rales.  Abdominal: Soft. Bowel sounds are normal. He exhibits no distension. There is no abdominal tenderness. There is no guarding.  Musculoskeletal:        General: No edema.  Neurological: He is alert and oriented to person, place, and time. No cranial nerve deficit. Coordination normal.  Speech fluent, tongue midline, no facial droop Strength 5 out of 5 in bilateral upper and lower extremities.  Sensation to light touch intact throughout.  Skin: Skin is warm and dry. He is not diaphoretic.  Psychiatric: He has a normal mood and affect. His behavior is normal.     Labs on Admission: I have personally reviewed following labs and imaging studies  CBC: Recent Labs  Lab 08/25/18 1305  WBC 7.3  NEUTROABS 5.2  HGB 12.6*  HCT 40.2  MCV 83.6  PLT 278   Basic Metabolic Panel: Recent Labs  Lab 08/25/18 1305  NA  140  K 4.2  CL 106  CO2 26  GLUCOSE 90  BUN 10  CREATININE 1.04  CALCIUM 9.0   GFR: Estimated Creatinine Clearance: 85 mL/min (by C-G formula based on SCr of 1.04 mg/dL). Liver Function Tests: No results for input(s): AST, ALT, ALKPHOS, BILITOT, PROT, ALBUMIN in the last 168 hours. No results for input(s): LIPASE, AMYLASE in the last 168 hours. No results for input(s): AMMONIA in the last 168 hours. Coagulation Profile: No results for input(s): INR, PROTIME in the last 168 hours. Cardiac Enzymes: No results for input(s): CKTOTAL, CKMB, CKMBINDEX, TROPONINI in the last 168 hours. BNP (last 3 results) No results for input(s): PROBNP in the last 8760 hours. HbA1C: No results for input(s): HGBA1C in the last 72 hours. CBG: No results for input(s): GLUCAP in the last 168 hours. Lipid Profile: No results for input(s): CHOL, HDL, LDLCALC, TRIG, CHOLHDL, LDLDIRECT in the last 72 hours. Thyroid Function Tests: No results for input(s): TSH, T4TOTAL, FREET4, T3FREE, THYROIDAB in the last 72 hours. Anemia Panel: No results for input(s): VITAMINB12, FOLATE, FERRITIN, TIBC, IRON, RETICCTPCT in the last 72 hours. Urine analysis:    Component Value Date/Time   BILIRUBINUR N 08/22/2017 1037   PROTEINUR trace 08/22/2017 1037   UROBILINOGEN 0.2 08/22/2017 1037   NITRITE N 08/22/2017 1037   LEUKOCYTESUR Negative 08/22/2017 1037    Radiological Exams on Admission: Ct Head Wo Contrast  Result Date: 08/25/2018 CLINICAL DATA:  Paresthesias, numbness or tingling in the leg since Saturday, smoker EXAM: CT HEAD WITHOUT CONTRAST TECHNIQUE: Contiguous axial images were obtained from the base of the skull through the vertex without intravenous contrast. Sagittal and coronal MPR images reconstructed from axial data set. COMPARISON:  None FINDINGS: Brain: Normal ventricular morphology. No midline shift or mass effect. Tiny old lacunar infarct at RIGHT caudate head. Low-attenuation identified at the  anterior limb of LEFT internal capsule consistent with age-indeterminate infarct. Minimal small vessel chronic ischemic changes of deep cerebral white matter. No intracranial hemorrhage, mass lesion or additional evidence of infarction. No extra-axial fluid collections. Vascular: Unremarkable Skull: Intact Sinuses/Orbits: Clear Other: N/A IMPRESSION: Atrophy with minimal small vessel chronic ischemic changes of deep cerebral white matter. Age-indeterminate lacunar infarct at anterior limb of LEFT internal capsule. Tiny old lacunar infarct RIGHT caudate head. Electronically Signed   By: Ulyses SouthwardMark  Boles M.D.   On: 08/25/2018 13:35   Mr Brain Wo Contrast (neuro Protocol)  Result Date: 08/25/2018 CLINICAL DATA:  58 year old male with 3 days of left hand and leg numbness, paresthesia. EXAM: MRI HEAD WITHOUT CONTRAST TECHNIQUE: Multiplanar, multiecho pulse sequences of the brain and surrounding structures were obtained without intravenous contrast. COMPARISON:  Head CT without contrast 1325 hours today. FINDINGS:  Brain: 2 centimeter confluent wedge-shaped focus of restricted diffusion in the lateral right thalamus bordering the posterior limb right internal capsule. T2 and FLAIR hyperintensity. No associated hemorrhage or mass effect. No other restricted diffusion. Age advanced patchy and scattered T2 and FLAIR hyperintensity in the bilateral cerebral white matter, bilateral pons, and to a lesser extent bilateral deep gray matter (right caudate, left thalamus). No chronic cerebral blood products. No cortical encephalomalacia identified. No midline shift, mass effect, evidence of mass lesion, ventriculomegaly, extra-axial collection or acute intracranial hemorrhage. Cervicomedullary junction and pituitary are within normal limits. Vascular: Major intracranial vascular flow voids are preserved. The right vertebral artery appears dominant. Skull and upper cervical spine: Negative visible cervical spine. Normal bone marrow  signal. Sinuses/Orbits: Mildly Disconjugate gaze, otherwise negative orbits. Mild paranasal sinus mucosal thickening. Other: Mastoids are clear. Visible internal auditory structures appear normal. Negative scalp and face soft tissues. IMPRESSION: 1. Acute 2 cm lacunar infarct in the lateral Right Thalamus. No associated hemorrhage or mass effect. 2. Underlying moderate to severe for age chronic small vessel disease in the cerebral white matter, deep gray matter, and pons. Electronically Signed   By: Odessa Fleming M.D.   On: 08/25/2018 18:21    EKG: Independently reviewed.  Normal sinus rhythm (heart rate 73).  Assessment/Plan Principal Problem:   Acute CVA (cerebrovascular accident) (HCC) Active Problems:   Anemia   Tobacco use   Elevated blood pressure reading   Acute CVA Patient has presented to the hospital outside of TPA window. Reports 1.5-week history of intermittent weakness and numbness of his left lower extremity and a 1 day history of tingling of his left arm and forearm.  Risk factors for stroke include tobacco use.  MRI showing acute 2 cm lacunar infarct in the lateral right thalamus.  No associated hemorrhage or mass-effect.   -Cardiac monitoring -Aspirin 325 mg daily -Check A1c, lipid panel -Carotid Dopplers -Echocardiogram -Risk factor modification -PT/OT/SLP -Allow permissive hypertension up to 220/120 -Consult neurology in the morning  Elevated blood pressure No history of hypertension in the chart.  Blood pressure elevated with systolic up to the 160s. -Continue to monitor.  Hold off giving any antihypertensives at this time to allow permissive hypertension in the setting of acute CVA.  Anemia Hemoglobin 12.6.  Colonoscopy done in March 2019 showing internal hemorrhoids and no polyps. -Check iron, ferritin, TIBC  Tobacco use Currently smoking 7 to 8 cigarettes/day.  I discussed smoking cessation but he appears to be in the pre-contemplative stage at this time.  He is  refusing nicotine patch while in the hospital.  DVT prophylaxis: Lovenox Code Status: Patient wishes to be full code. Family Communication: Family at bedside. Disposition Plan: Anticipate discharge in 1 to 2 days. Consults called: None Admission status: Observation, telemetry   John Giovanni MD [facility] Pager [phone]  If 7PM-7AM, please contact night-coverage [email or web address] Password [facility]  08/26/2018, 12:32 AM

## 2018-08-26 NOTE — Consult Note (Addendum)
Neurology Consultation  Reason for Consult: Stroke  Referring Physician: Dr. Marland McalpineSheikh  CC: Left leg weakness and right hand tingling  History is obtained from: Patient  HPI: Theodore Schaefer Theodore Schaefer is a 58 y.o. male with tobacco abuse and no other history.  Patient has not seen a PCP in approximately 1 year.  He does not take aspirin.  Works as a Museum/gallery exhibitions officerforklift driver.  Patient stated that 2 weeks ago he went to [place] and slept in a bed that was hard; the next morning he woke up and he noted that his left leg was dragging with approximately "every 6th step".  Later, the symptoms went away or were no longer noticed. About one week after onset of the left leg symptoms, his left hand and forearm started tingling, but were not weak. Then, this past Sunday, the leg symptoms came back after sleeping on the same bed that he was in 2 weeks ago.  On Monday he went to work and noted that he still had some weakness in the left leg, and his left hand was still tingling.  At that point he decided he needed to come to the hospital and be evaluated.  LKW: 08/23/2018 with no specific time tpa given?:  No, out of the window Premorbid modified Rankin scale (mRS): 0 NIH stroke scale of 0  ROS:  ROS was performed and is negative except as noted in the HPI.   History reviewed. No pertinent past medical history.  Family History  Problem Relation Age of Onset  . Cancer Mother   . Heart disease Father   . Hypertension Father   . Heart disease Sister   . Heart disease Brother   . Hypertension Brother   . Colon cancer Neg Hx   . Colon polyps Neg Hx   . Esophageal cancer Neg Hx   . Rectal cancer Neg Hx   . Stomach cancer Neg Hx     Social History:   reports that he has been smoking cigarettes. He has been smoking about 0.25 packs per day. He has never used smokeless tobacco. He reports current alcohol use. He reports that he does not use drugs.  Medications  Current Facility-Administered Medications:  .   acetaminophen (TYLENOL) tablet 650 mg, 650 mg, Oral, Q4H PRN **OR** acetaminophen (TYLENOL) solution 650 mg, 650 mg, Per Tube, Q4H PRN **OR** acetaminophen (TYLENOL) suppository 650 mg, 650 mg, Rectal, Q4H PRN, John Giovanniathore, Vasundhra, MD .  aspirin tablet 325 mg, 325 mg, Oral, Daily, John Giovanniathore, Vasundhra, MD, 325 mg at 08/26/18 0935 .  enoxaparin (LOVENOX) injection 40 mg, 40 mg, Subcutaneous, Q24H, John Giovanniathore, Vasundhra, MD, 40 mg at 08/25/18 2154   Exam: Current vital signs: BP (!) 155/97 (BP Location: Right Arm)   Pulse 60   Temp 97.6 F (36.4 C) (Oral)   Resp 18   Ht 6' (1.829 m)   Wt 76.7 kg   SpO2 100%   BMI 22.92 kg/m  Vital signs in last 24 hours: Temp:  [97.6 F (36.4 C)-98.4 F (36.9 C)] 97.6 F (36.4 C) (01/15 0856) Pulse Rate:  [55-76] 60 (01/15 0856) Resp:  [16-18] 18 (01/15 0856) BP: (129-168)/(72-103) 155/97 (01/15 0856) SpO2:  [99 %-100 %] 100 % (01/15 0856) Weight:  [76.7 kg] 76.7 kg (01/14 1207)  Physical Exam  Constitutional: Appears well-developed and well-nourished.  Psych: Affect appropriate to situation Eyes: No scleral injection HENT: No OP obstrucion Head: Normocephalic.  Cardiovascular: Normal rate and regular rhythm.  Respiratory: Effort normal, non-labored breathing GI: Soft.  No distension. There is no tenderness.  Skin: WDI  Neuro: Mental Status: Patient is awake, alert, oriented to person, place, month, year, and situation. Patient is able to give a clear and coherent history. No signs of aphasia or neglect Cranial Nerves: II: Visual Fields are full.    III,IV, VI: EOMI without ptosis or diploplia. Pupils are equal, round, and reactive to light. V: Facial sensation is symmetric to temperature VII: Facial movement is symmetric.  VIII: hearing is intact to voice X: Uvula elevates symmetrically XI: Shoulder shrug is symmetric. XII: tongue is midline without atrophy or fasciculations.  Motor: Tone is normal. Bulk is normal. 5/5 strength was  present in all four extremities.  Sensory: Decreased sensation to light touch and temperature in stocking distribution. Special attention to the upper extremities and hands reveals no temperature or fine touch sensation loss.  Deep Tendon Reflexes: 2+ in the biceps and brachioradialis bilaterally. Trace knee jerks.  Plantars: Downgoing bilaterally Cerebellar: FNF and HKS are intact bilaterally Gait: Deferred   Labs I have reviewed labs in epic and the results pertinent to this consultation are:   CBC    Component Value Date/Time   WBC 7.4 08/26/2018 0925   RBC 5.21 08/26/2018 0925   HGB 13.1 08/26/2018 0925   HCT 42.7 08/26/2018 0925   PLT 260 08/26/2018 0925   MCV 82.0 08/26/2018 0925   MCH 25.1 (L) 08/26/2018 0925   MCHC 30.7 08/26/2018 0925   RDW 13.7 08/26/2018 0925   LYMPHSABS 1.3 08/26/2018 0925   MONOABS 0.5 08/26/2018 0925   EOSABS 0.1 08/26/2018 0925   BASOSABS 0.1 08/26/2018 0925    CMP     Component Value Date/Time   NA 140 08/26/2018 0925   K 4.4 08/26/2018 0925   CL 106 08/26/2018 0925   CO2 29 08/26/2018 0925   GLUCOSE 97 08/26/2018 0925   BUN 8 08/26/2018 0925   CREATININE 0.99 08/26/2018 0925   CALCIUM 9.1 08/26/2018 0925   PROT 6.6 08/26/2018 0925   ALBUMIN 3.5 08/26/2018 0925   AST 18 08/26/2018 0925   ALT 16 08/26/2018 0925   ALKPHOS 57 08/26/2018 0925   BILITOT 1.2 08/26/2018 0925   GFRNONAA >60 08/26/2018 0925   GFRAA >60 08/26/2018 0925    Lipid Panel     Component Value Date/Time   CHOL 218 (H) 08/26/2018 0451   TRIG 96 08/26/2018 0451   HDL 69 08/26/2018 0451   CHOLHDL 3.2 08/26/2018 0451   VLDL 19 08/26/2018 0451   LDLCALC 130 (H) 08/26/2018 0451   LDL-130 A1c- 5.6 Carotid Dopplers: 1-39% stenosis bilateral ICAs with antegrade flow in vertebral arteries Echocardiogram: EF 50-55% EKG: NSR  Imaging I have reviewed the images obtained:  CT-scan of the brain- atrophy with minimal small vessel chronic ischemic changes of the  deep cerebral white matter.  Age-indeterminate lacunar infarct anterior limb of the left internal capsule.  Teeny old lacunar infarct in the right caudate head.  MRI examination of the brain- acute 2 cm lacunar infarct in the lateral right thalamus.  No associated hemorrhage or mass-effect.  Felicie Morn PA-C [facility] [phone] 08/26/2018, 10:48 AM     Assessment: 58 year old male with tobacco abuse presenting to the hospital with left leg weakness and left hand tingling.   1. MRI reveals an acute 2 cm infarct in the lateral right thalamus. Likely secondary to chronic hypertensive small vessel disease.  2. Although he has not been diagnosed with HTN in the past, he likely has  chronic HTN. His SBP has ranged from 130 to 170 this admission.  3. Also with stroke risk factor of tobacco abuse 4. Stroke work up is complete except for MRA head. No indication for CEA based on carotid ultrasound results. No atrial fibrillation on EKG or mural thrombus on TTE.    Recommendations: - Continue aspirin daily - Continue Lipitor - Tobacco cessation - PT -- MRA head - Follow-up with Neurology as an outpatient -- BP management with long term goal of 120/80. He is well out of the permissive HTN time window.   I have interviewed and examined the patient. I have amended the documented exam findings. I have formulated the assessment and plan.  Electronically signed: Dr. Caryl PinaEric Sorin Frimpong

## 2018-08-26 NOTE — Progress Notes (Signed)
  Echocardiogram 2D Echocardiogram has been performed.  Theodore Schaefer 08/26/2018, 8:46 AM

## 2018-08-27 DIAGNOSIS — E785 Hyperlipidemia, unspecified: Secondary | ICD-10-CM

## 2018-08-27 DIAGNOSIS — F1210 Cannabis abuse, uncomplicated: Secondary | ICD-10-CM

## 2018-08-27 LAB — RAPID URINE DRUG SCREEN, HOSP PERFORMED
Amphetamines: NOT DETECTED
Barbiturates: NOT DETECTED
Benzodiazepines: NOT DETECTED
Cocaine: NOT DETECTED
Opiates: NOT DETECTED
Tetrahydrocannabinol: POSITIVE — AB

## 2018-08-27 MED ORDER — ASPIRIN 325 MG PO TABS: 325.0000 mg | ORAL_TABLET | Freq: Every day | ORAL | 11 refills | Status: DC

## 2018-08-27 MED ORDER — ATORVASTATIN CALCIUM 80 MG PO TABS: 80.0000 mg | ORAL_TABLET | Freq: Every day | ORAL | 0 refills | Status: DC

## 2018-08-27 MED ORDER — CLOPIDOGREL BISULFATE 75 MG PO TABS
75.0000 mg | ORAL_TABLET | Freq: Every day | ORAL | Status: DC
  Administered 2018-08-27: 75 mg via ORAL
  Filled 2018-08-27: qty 1

## 2018-08-27 MED ORDER — CLOPIDOGREL BISULFATE 75 MG PO TABS: 75.0000 mg | ORAL_TABLET | Freq: Every day | ORAL | 0 refills | Status: DC

## 2018-08-27 NOTE — Discharge Summary (Signed)
Physician Discharge Summary  Marin Robertsony Dion Kilgallon ZDG:[id number] DOB: 05-Mar-1961 DOA: 08/25/2018  PCP: Nelwyn SalisburyFry, Stephen A, MD  Admit date: 08/25/2018 Discharge date: 08/27/2018  Admitted From: Home Disposition: Home  Recommendations for Outpatient Follow-up:  1. Follow up with PCP in 1-2 weeks 2. Follow up with Neurology within 4 weeks 3. Please obtain CMP/CBC, Mag, Phos in one week 4. Please follow up on the following pending results:  Home Health: No  Equipment/Devices: None recommended by PT/OT    Discharge Condition: Stable CODE STATUS: FULL CODE Diet recommendation: Heart Healthy Diet   Brief/Interim Summary: Patient is a 58 year old African-American male with a past medical history significant for tobacco abuse as well as history of internal hemorrhoids as well as Achilles tendon repair and other comorbidities who presented to the hospital for left leg weakness and left hand tingling.  Last 1-1/2 weeks patient has been having intermittent left leg weakness which caused him to drag left leg while walking sometimes.  He is also had experience intermittent numbness in tingling in the same leg and yesterday had his left hand and forearm tingling.  Family noted denied any drooping of his face or slurring of speech.  He presented to the emergency room was found to have an acute CVA and was not given TPA because he is outside of window.  He was placed on cardiac telemetry monitoring and aspirin 325 mg daily.  Hemoglobin A1c and lipid panel was obtained as well as carotid Doppler and echocardiogram.  Neurology was consulted for further evaluation recommendations and still have yet to evaluate the patient.  Echocardiogram was done and showed left cavity size was normal with wall thickness normal.  Systolic function was normal and the estimated EF was 50 to 55% with grade 2 diastolic dysfunction.  Vascular carotid Dopplers showed that both the left and right ICA were consistent with a 1 to 39% stenosis and  bilateral vertebral arteries demonstrated antegrade flow. Neurology evaluated and recommended MRA of the Head which showed no large vessel occlusion and intracranial atherosclerotic disease.  Neurology recommended place the patient on aspirin 325 mg and Plavix 95 mg daily for 3 months and then just aspirin 325 mg after that.  He completed stroke work-up and was deemed medically stable to be discharged and will need to follow-up with PCP as well as neurology in the outpatient setting in 4 weeks.  Discharge Diagnoses:  Principal Problem:   Acute CVA (cerebrovascular accident) (HCC) Active Problems:   Anemia   Tobacco use   Elevated blood pressure reading  Acute CVA -Patient has presented to the hospital outside of TPA window.  -Reports 1.5-week history of intermittent weakness and numbness of his left lower extremity and a 1 day history of tingling of his left arm and forearm.   -Risk factors for stroke include tobacco use.   -MRI showing acute 2 cm lacunar infarct in the lateral right thalamus.   -No associated hemorrhage or mass-effect.   -Cardiac monitoring -Aspirin 325 mg daily and Plavix 75 for 3 months and then just ASA alone  -Check A1c and was 5.6, lipid panel below -Carotid Dopplers with velocities in the right and left ICA are consistent with 1 to 39% stenosis in the bilateral vertebral arteries demonstrated antegrade flow -Echocardiogram done and showed normal EF of 50 to 55% with grade 2 diastolic dysfunction -Risk factor modification and advised stopping smoking -PT/OT/SLP managing no follow-up -Allow permissive hypertension up to 220/120 -Neurology consulted and recommended an MRA which was done and  showed no large vessel occlusion and intracranial atherosclerotic disease  Elevated blood pressure -No history of hypertension in the chart.   -Blood pressure elevated with systolic up to the 160s. -Continue to monitor.   -Hold off giving any antihypertensives at this time to  allow permissive hypertension in the setting of acute CVA. -We will up with PCP  Normocytic Anemia -Hemoglobin 12.6.   -Colonoscopy done in March 2019 showing internal hemorrhoids and no polyps. -Iron/anemia panel showed an iron level of 97, U IBC of 264, TIBC of 361, saturation ratios of 27% and ferritin level of 22 -Hemoglobin and hematocrit is now 13.1/42.7 -Continue monitor for signs of central bleeding and repeat CBC in the outpatient setting  Hyperlipidemia -Lipid Panel showed cholesterol of 218, HDL of 69, LDL 130, triglycerides 96, and VLDL of 19 -C/w Atorvastatin 80 mg po Daily  Tobacco Abuse/Marijuana Abuse -Currently smoking 7 to 8 cigarettes/day.  I discussed smoking cessation but he appears to be in the pre-contemplative stage at this time.  He is refusing nicotine patch while in the hospital. -UDS + for [facility] -Smoking Cessastion Counseling given   Discharge Instructions  Discharge Instructions    Call MD for:  difficulty breathing, headache or visual disturbances   Complete by:  As directed    Call MD for:  extreme fatigue   Complete by:  As directed    Call MD for:  hives   Complete by:  As directed    Call MD for:  persistant dizziness or light-headedness   Complete by:  As directed    Call MD for:  persistant nausea and vomiting   Complete by:  As directed    Call MD for:  redness, tenderness, or signs of infection (pain, swelling, redness, odor or green/yellow discharge around incision site)   Complete by:  As directed    Call MD for:  severe uncontrolled pain   Complete by:  As directed    Call MD for:  temperature >100.4   Complete by:  As directed    Diet - low sodium heart healthy   Complete by:  As directed    Discharge instructions   Complete by:  As directed    You were cared for by a hospitalist during your hospital stay. If you have any questions about your discharge medications or the care you received while you were in the hospital after you are  discharged, you can call the unit and ask to speak with the hospitalist on call if the hospitalist that took care of you is not available. Once you are discharged, your primary care physician will handle any further medical issues. Please note that NO REFILLS for any discharge medications will be authorized once you are discharged, as it is imperative that you return to your primary care physician (or establish a relationship with a primary care physician if you do not have one) for your aftercare needs so that they can reassess your need for medications and monitor your lab values.  Follow up with PCP and Neurology. Take all medications as prescribed. If symptoms change or worsen please return to the ED for evaluation   Increase activity slowly   Complete by:  As directed      Allergies as of 08/27/2018   No Known Allergies     Medication List    STOP taking these medications   ibuprofen 200 MG tablet Commonly known as:  ADVIL,MOTRIN   sildenafil 100 MG tablet Commonly known as:  VIAGRA     TAKE these medications   aspirin 325 MG tablet Take 1 tablet (325 mg total) by mouth daily. Start taking on:  August 28, 2018   atorvastatin 80 MG tablet Commonly known as:  LIPITOR Take 1 tablet (80 mg total) by mouth daily at 6 PM.   clopidogrel 75 MG tablet Commonly known as:  PLAVIX Take 1 tablet (75 mg total) by mouth daily. Start taking on:  August 28, 2018   ferrous sulfate 325 (65 FE) MG tablet Take 1 tablet (325 mg total) by mouth 2 (two) times daily with a meal.      Follow-up Information    [facility] Follow up in 4 week(s).   Why:  f/u in 4 weeks after discharge  with the stroke clinic.  Contact information: [address] [place], [address] [place] [address] [phone]       Nelwyn Salisbury, MD. Call.   Specialty:  Family Medicine Why:  Follow up within 1 week  Contact information: [address] Christena Flake [facility] [place]  [address] [phone]          No Known Allergies  Consultations:  Neurology  Procedures/Studies: Ct Head Wo Contrast  Result Date: 08/25/2018 CLINICAL DATA:  Paresthesias, numbness or tingling in the leg since Saturday, smoker EXAM: CT HEAD WITHOUT CONTRAST TECHNIQUE: Contiguous axial images were obtained from the base of the skull through the vertex without intravenous contrast. Sagittal and coronal MPR images reconstructed from axial data set. COMPARISON:  None FINDINGS: Brain: Normal ventricular morphology. No midline shift or mass effect. Tiny old lacunar infarct at RIGHT caudate head. Low-attenuation identified at the anterior limb of LEFT internal capsule consistent with age-indeterminate infarct. Minimal small vessel chronic ischemic changes of deep cerebral white matter. No intracranial hemorrhage, mass lesion or additional evidence of infarction. No extra-axial fluid collections. Vascular: Unremarkable Skull: Intact Sinuses/Orbits: Clear Other: N/A IMPRESSION: Atrophy with minimal small vessel chronic ischemic changes of deep cerebral white matter. Age-indeterminate lacunar infarct at anterior limb of LEFT internal capsule. Tiny old lacunar infarct RIGHT caudate head. Electronically Signed   By: Ulyses Southward M.D.   On: 08/25/2018 13:35   Mr Maxine Glenn Head Wo Contrast  Result Date: 08/26/2018 CLINICAL DATA:  Stroke EXAM: MRA HEAD WITHOUT CONTRAST TECHNIQUE: Angiographic images of the Circle of Willis were obtained using MRA technique without intravenous contrast. COMPARISON:  MRI head 08/25/2018 FINDINGS: Right vertebral dominant and widely patent. Right PICA patent. Small left vertebral artery ends in PICA. Basilar widely patent. Superior cerebellar and posterior cerebral arteries patent bilaterally. Mild stenosis right P1 segment. Patent posterior communicating artery bilaterally. Internal carotid artery widely patent bilaterally. Anterior and middle cerebral arteries patent bilaterally.  Moderate stenosis right A2 segment. Moderate stenosis inferior division of right middle cerebral artery. Mild to moderate stenosis distal left M1 segment. Negative for aneurysm. IMPRESSION: Intracranial atherosclerotic disease as above. No large vessel occlusion. Electronically Signed   By: Marlan Palau M.D.   On: 08/26/2018 18:16   Mr Brain Wo Contrast (neuro Protocol)  Result Date: 08/25/2018 CLINICAL DATA:  58 year old male with 3 days of left hand and leg numbness, paresthesia. EXAM: MRI HEAD WITHOUT CONTRAST TECHNIQUE: Multiplanar, multiecho pulse sequences of the brain and surrounding structures were obtained without intravenous contrast. COMPARISON:  Head CT without contrast 1325 hours today. FINDINGS: Brain: 2 centimeter confluent wedge-shaped focus of restricted diffusion in the lateral right thalamus bordering the posterior limb right internal capsule. T2 and FLAIR hyperintensity. No associated hemorrhage  or mass effect. No other restricted diffusion. Age advanced patchy and scattered T2 and FLAIR hyperintensity in the bilateral cerebral white matter, bilateral pons, and to a lesser extent bilateral deep gray matter (right caudate, left thalamus). No chronic cerebral blood products. No cortical encephalomalacia identified. No midline shift, mass effect, evidence of mass lesion, ventriculomegaly, extra-axial collection or acute intracranial hemorrhage. Cervicomedullary junction and pituitary are within normal limits. Vascular: Major intracranial vascular flow voids are preserved. The right vertebral artery appears dominant. Skull and upper cervical spine: Negative visible cervical spine. Normal bone marrow signal. Sinuses/Orbits: Mildly Disconjugate gaze, otherwise negative orbits. Mild paranasal sinus mucosal thickening. Other: Mastoids are clear. Visible internal auditory structures appear normal. Negative scalp and face soft tissues. IMPRESSION: 1. Acute 2 cm lacunar infarct in the lateral Right  Thalamus. No associated hemorrhage or mass effect. 2. Underlying moderate to severe for age chronic small vessel disease in the cerebral white matter, deep gray matter, and pons. Electronically Signed   By: Odessa Fleming M.D.   On: 08/25/2018 18:21   Vas US Carotid (at [facility])  Result Date: 08/27/2018 Carotid Arterial Duplex Study Indications: CVA. Performing Technologist: Blanch Media RVS  Examination Guidelines: A complete evaluation includes B-mode imaging, spectral Doppler, color Doppler, and power Doppler as needed of all accessible portions of each vessel. Bilateral testing is considered an integral part of a complete examination. Limited examinations for reoccurring indications may be performed as noted.  Right Carotid Findings: +----------+--------+--------+--------+-----------+--------+           PSV cm/sEDV cm/sStenosisDescribe   Comments +----------+--------+--------+--------+-----------+--------+ CCA Prox  93      24              homogeneous         +----------+--------+--------+--------+-----------+--------+ CCA Distal70      21              homogeneous         +----------+--------+--------+--------+-----------+--------+ ICA Prox  61      26      1-39%   homogeneous         +----------+--------+--------+--------+-----------+--------+ ICA Distal82      26                                  +----------+--------+--------+--------+-----------+--------+ ECA       81      11                                  +----------+--------+--------+--------+-----------+--------+ +----------+--------+-------+--------+-------------------+           PSV cm/sEDV cmsDescribeArm Pressure (mmHG) +----------+--------+-------+--------+-------------------+ [id number]                                         +----------+--------+-------+--------+-------------------+ +---------+--------+--+--------+--+---------+ VertebralPSV cm/s60EDV cm/s20Antegrade  +---------+--------+--+--------+--+---------+  Left Carotid Findings: +----------+--------+--------+--------+-----------+--------+           PSV cm/sEDV cm/sStenosisDescribe   Comments +----------+--------+--------+--------+-----------+--------+ CCA Prox  91      27              homogeneous         +----------+--------+--------+--------+-----------+--------+ CCA Distal70      26              homogeneous         +----------+--------+--------+--------+-----------+--------+  ICA Prox  82      33      1-39%   homogeneous         +----------+--------+--------+--------+-----------+--------+ ICA Distal97      37                                  +----------+--------+--------+--------+-----------+--------+ ECA       77      16                                  +----------+--------+--------+--------+-----------+--------+ +----------+--------+--------+--------+-------------------+ SubclavianPSV cm/sEDV cm/sDescribeArm Pressure (mmHG) +----------+--------+--------+--------+-------------------+           77                                          +----------+--------+--------+--------+-------------------+ +---------+--------+--+--------+-+---------+ VertebralPSV cm/s32EDV cm/s9Antegrade +---------+--------+--+--------+-+---------+  Summary: Right Carotid: Velocities in the right ICA are consistent with a 1-39% stenosis. Left Carotid: Velocities in the left ICA are consistent with a 1-39% stenosis. Vertebrals: Bilateral vertebral arteries demonstrate antegrade flow. *See table(s) above for measurements and observations.  Electronically signed by Delia Heady MD on 08/27/2018 at 8:20:53 AM.    Final     ECHOCARDIOGRAM ------------------------------------------------------------------- Study Conclusions  - Left ventricle: The cavity size was normal. Wall thickness was   normal. Systolic function was normal. The estimated ejection   fraction was in the range of 50%  to 55%. Wall motion was normal;   there were no regional wall motion abnormalities. Features are   consistent with a pseudonormal left ventricular filling pattern,   with concomitant abnormal relaxation and increased filling   pressure (grade 2 diastolic dysfunction). - Mitral valve: There was mild regurgitation.   Subjective: Seen and examined at bedside was doing well.  Denies chest pain, lightheadedness or dizziness.  No nausea or vomiting but did have some slight weakness in his foot.  He understands that he needs to follow-up with neurology and he was discharged on aspirin 325 mg p.o. as well as Plavix and 5 mg for 3 months and then just aspirin alone.  No other concerns or complaints at this time is ready to go home  Discharge Exam: Vitals:   08/27/18 0742 08/27/18 1144  BP: (!) 153/88 (!) 148/99  Pulse: (!) 56 64  Resp: 20 20  Temp: 97.9 F (36.6 C) 97.8 F (36.6 C)  SpO2: 100% 98%   Vitals:   08/27/18 0018 08/27/18 0457 08/27/18 0742 08/27/18 1144  BP: (!) 142/74 (!) 176/86 (!) 153/88 (!) 148/99  Pulse: (!) 57 70 (!) 56 64  Resp: (!) 22 20 20 20   Temp: 98.3 F (36.8 C) 97.7 F (36.5 C) 97.9 F (36.6 C) 97.8 F (36.6 C)  TempSrc: Oral Oral Oral Oral  SpO2: 100% 99% 100% 98%  Weight:      Height:       General: Pt is alert, awake, not in acute distress Cardiovascular: RRR, S1/S2 +, no rubs, no gallops Respiratory: Diminished bilaterally, no wheezing, no rhonchi Abdominal: Soft, NT, ND, bowel sounds + Extremities: no edema, no cyanosis  The results of significant diagnostics from this hospitalization (including imaging, microbiology, ancillary and laboratory) are listed below for reference.    Microbiology: No results found for this or any previous  visit (from the past 240 hour(s)).   Labs: BNP (last 3 results) No results for input(s): BNP in the last 8760 hours. Basic Metabolic Panel: Recent Labs  Lab 08/25/18 1305 08/26/18 0925  NA 140 140  K 4.2 4.4  CL  106 106  CO2 26 29  GLUCOSE 90 97  BUN 10 8  CREATININE 1.04 0.99  CALCIUM 9.0 9.1  MG  --  2.0  PHOS  --  2.5   Liver Function Tests: Recent Labs  Lab 08/26/18 0925  AST 18  ALT 16  ALKPHOS 57  BILITOT 1.2  PROT 6.6  ALBUMIN 3.5   No results for input(s): LIPASE, AMYLASE in the last 168 hours. No results for input(s): AMMONIA in the last 168 hours. CBC: Recent Labs  Lab 08/25/18 1305 08/26/18 0925  WBC 7.3 7.4  NEUTROABS 5.2 5.4  HGB 12.6* 13.1  HCT 40.2 42.7  MCV 83.6 82.0  PLT 278 260   Cardiac Enzymes: No results for input(s): CKTOTAL, CKMB, CKMBINDEX, TROPONINI in the last 168 hours. BNP: Invalid input(s): POCBNP CBG: No results for input(s): GLUCAP in the last 168 hours. D-Dimer No results for input(s): DDIMER in the last 72 hours. Hgb A1c Recent Labs    08/26/18 0451  HGBA1C 5.6   Lipid Profile Recent Labs    08/26/18 0451  CHOL 218*  HDL 69  LDLCALC 130*  TRIG 96  CHOLHDL 3.2   Thyroid function studies No results for input(s): TSH, T4TOTAL, T3FREE, THYROIDAB in the last 72 hours.  Invalid input(s): FREET3 Anemia work up Recent Labs    08/26/18 0451  FERRITIN 22*  TIBC 361  IRON 97   Urinalysis    Component Value Date/Time   BILIRUBINUR N 08/22/2017 1037   PROTEINUR trace 08/22/2017 1037   UROBILINOGEN 0.2 08/22/2017 1037   NITRITE N 08/22/2017 1037   LEUKOCYTESUR Negative 08/22/2017 1037   Sepsis Labs Invalid input(s): PROCALCITONIN,  WBC,  LACTICIDVEN Microbiology No results found for this or any previous visit (from the past 240 hour(s)).  Time coordinating discharge: 35 minutes  SIGNED:  Merlene Laughtermair Latif , DO [facility] 08/27/2018, 12:28 PM Pager is on AMION  If 7PM-7AM, please contact night-coverage [email or web address] Password [email or web address]

## 2018-08-27 NOTE — Progress Notes (Addendum)
STROKE TEAM PROGRESS NOTE   INTERVAL HISTORY No one is at bedside. Patient in bed, awake, alert, NAD. Ready to go home. Discussed POC and smoking cessation with patient. Patient agree to try and quit.  Vitals:   08/26/18 2100 08/27/18 0018 08/27/18 0457 08/27/18 0742  BP: 127/78 (!) 142/74 (!) 176/86 (!) 153/88  Pulse: 63 (!) 57 70 (!) 56  Resp: 20 (!) 22 20 20   Temp:  98.3 F (36.8 C) 97.7 F (36.5 C) 97.9 F (36.6 C)  TempSrc:  Oral Oral Oral  SpO2: 100% 100% 99% 100%  Weight:      Height:        CBC:  Recent Labs  Lab 08/25/18 1305 08/26/18 0925  WBC 7.3 7.4  NEUTROABS 5.2 5.4  HGB 12.6* 13.1  HCT 40.2 42.7  MCV 83.6 82.0  PLT 278 260    Basic Metabolic Panel:  Recent Labs  Lab 08/25/18 1305 08/26/18 0925  NA 140 140  K 4.2 4.4  CL 106 106  CO2 26 29  GLUCOSE 90 97  BUN 10 8  CREATININE 1.04 0.99  CALCIUM 9.0 9.1  MG  --  2.0  PHOS  --  2.5   Lipid Panel:     Component Value Date/Time   CHOL 218 (H) 08/26/2018 0451   TRIG 96 08/26/2018 0451   HDL 69 08/26/2018 0451   CHOLHDL 3.2 08/26/2018 0451   VLDL 19 08/26/2018 0451   LDLCALC 130 (H) 08/26/2018 0451   HgbA1c:  Lab Results  Component Value Date   HGBA1C 5.6 08/26/2018   Urine Drug Screen: No results found for: LABOPIA, COCAINSCRNUR, LABBENZ, AMPHETMU, THCU, LABBARB  Alcohol Level No results found for: ETH  IMAGING Ct Head Wo Contrast  Result Date: 08/25/2018 CLINICAL DATA:  Paresthesias, numbness or tingling in the leg since Saturday, smoker EXAM: CT HEAD WITHOUT CONTRAST TECHNIQUE: Contiguous axial images were obtained from the base of the skull through the vertex without intravenous contrast. Sagittal and coronal MPR images reconstructed from axial data set. COMPARISON:  None FINDINGS: Brain: Normal ventricular morphology. No midline shift or mass effect. Tiny old lacunar infarct at RIGHT caudate head. Low-attenuation identified at the anterior limb of LEFT internal capsule consistent  with age-indeterminate infarct. Minimal small vessel chronic ischemic changes of deep cerebral white matter. No intracranial hemorrhage, mass lesion or additional evidence of infarction. No extra-axial fluid collections. Vascular: Unremarkable Skull: Intact Sinuses/Orbits: Clear Other: N/A IMPRESSION: Atrophy with minimal small vessel chronic ischemic changes of deep cerebral white matter. Age-indeterminate lacunar infarct at anterior limb of LEFT internal capsule. Tiny old lacunar infarct RIGHT caudate head. Electronically Signed   By: Ulyses Southward M.D.   On: 08/25/2018 13:35   Mr Maxine Glenn Head Wo Contrast  Result Date: 08/26/2018 CLINICAL DATA:  Stroke EXAM: MRA HEAD WITHOUT CONTRAST TECHNIQUE: Angiographic images of the Circle of Willis were obtained using MRA technique without intravenous contrast. COMPARISON:  MRI head 08/25/2018 FINDINGS: Right vertebral dominant and widely patent. Right PICA patent. Small left vertebral artery ends in PICA. Basilar widely patent. Superior cerebellar and posterior cerebral arteries patent bilaterally. Mild stenosis right P1 segment. Patent posterior communicating artery bilaterally. Internal carotid artery widely patent bilaterally. Anterior and middle cerebral arteries patent bilaterally. Moderate stenosis right A2 segment. Moderate stenosis inferior division of right middle cerebral artery. Mild to moderate stenosis distal left M1 segment. Negative for aneurysm. IMPRESSION: Intracranial atherosclerotic disease as above. No large vessel occlusion. Electronically Signed   By: Marlan Palau  M.D.   On: 08/26/2018 18:16   Mr Brain Wo Contrast (neuro Protocol)  Result Date: 08/25/2018 CLINICAL DATA:  58 year old male with 3 days of left hand and leg numbness, paresthesia. EXAM: MRI HEAD WITHOUT CONTRAST TECHNIQUE: Multiplanar, multiecho pulse sequences of the brain and surrounding structures were obtained without intravenous contrast. COMPARISON:  Head CT without contrast 1325  hours today. FINDINGS: Brain: 2 centimeter confluent wedge-shaped focus of restricted diffusion in the lateral right thalamus bordering the posterior limb right internal capsule. T2 and FLAIR hyperintensity. No associated hemorrhage or mass effect. No other restricted diffusion. Age advanced patchy and scattered T2 and FLAIR hyperintensity in the bilateral cerebral white matter, bilateral pons, and to a lesser extent bilateral deep gray matter (right caudate, left thalamus). No chronic cerebral blood products. No cortical encephalomalacia identified. No midline shift, mass effect, evidence of mass lesion, ventriculomegaly, extra-axial collection or acute intracranial hemorrhage. Cervicomedullary junction and pituitary are within normal limits. Vascular: Major intracranial vascular flow voids are preserved. The right vertebral artery appears dominant. Skull and upper cervical spine: Negative visible cervical spine. Normal bone marrow signal. Sinuses/Orbits: Mildly Disconjugate gaze, otherwise negative orbits. Mild paranasal sinus mucosal thickening. Other: Mastoids are clear. Visible internal auditory structures appear normal. Negative scalp and face soft tissues. IMPRESSION: 1. Acute 2 cm lacunar infarct in the lateral Right Thalamus. No associated hemorrhage or mass effect. 2. Underlying moderate to severe for age chronic small vessel disease in the cerebral white matter, deep gray matter, and pons. Electronically Signed   By: Odessa Fleming M.D.   On: 08/25/2018 18:21   Vas US Carotid (at [facility])  Result Date: 08/26/2018 Carotid Arterial Duplex Study Indications: CVA. Performing Technologist: Blanch Media RVS  Examination Guidelines: A complete evaluation includes B-mode imaging, spectral Doppler, color Doppler, and power Doppler as needed of all accessible portions of each vessel. Bilateral testing is considered an integral part of a complete examination. Limited examinations for reoccurring indications may  be performed as noted.  Right Carotid Findings: +----------+--------+--------+--------+-----------+--------+           PSV cm/sEDV cm/sStenosisDescribe   Comments +----------+--------+--------+--------+-----------+--------+ CCA Prox  93      24              homogeneous         +----------+--------+--------+--------+-----------+--------+ CCA Distal70      21              homogeneous         +----------+--------+--------+--------+-----------+--------+ ICA Prox  61      26      1-39%   homogeneous         +----------+--------+--------+--------+-----------+--------+ ICA Distal82      26                                  +----------+--------+--------+--------+-----------+--------+ ECA       81      11                                  +----------+--------+--------+--------+-----------+--------+ +----------+--------+-------+--------+-------------------+           PSV cm/sEDV cmsDescribeArm Pressure (mmHG) +----------+--------+-------+--------+-------------------+ [id number]                                         +----------+--------+-------+--------+-------------------+ +---------+--------+--+--------+--+---------+  VertebralPSV cm/s60EDV cm/s20Antegrade +---------+--------+--+--------+--+---------+  Left Carotid Findings: +----------+--------+--------+--------+-----------+--------+           PSV cm/sEDV cm/sStenosisDescribe   Comments +----------+--------+--------+--------+-----------+--------+ CCA Prox  91      27              homogeneous         +----------+--------+--------+--------+-----------+--------+ CCA Distal70      26              homogeneous         +----------+--------+--------+--------+-----------+--------+ ICA Prox  82      33      1-39%   homogeneous         +----------+--------+--------+--------+-----------+--------+ ICA Distal97      37                                   +----------+--------+--------+--------+-----------+--------+ ECA       77      16                                  +----------+--------+--------+--------+-----------+--------+ +----------+--------+--------+--------+-------------------+ SubclavianPSV cm/sEDV cm/sDescribeArm Pressure (mmHG) +----------+--------+--------+--------+-------------------+           77                                          +----------+--------+--------+--------+-------------------+ +---------+--------+--+--------+-+---------+ VertebralPSV cm/s32EDV cm/s9Antegrade +---------+--------+--+--------+-+---------+  Summary: Right Carotid: Velocities in the right ICA are consistent with a 1-39% stenosis. Left Carotid: Velocities in the left ICA are consistent with a 1-39% stenosis. Vertebrals: Bilateral vertebral arteries demonstrate antegrade flow. *See table(s) above for measurements and observations.     Preliminary     PHYSICAL EXAM Constitutional: Appears well-developed and well-nourished.  Psych: Affect appropriate to situation Eyes: No scleral injection HENT: No OP obstrucion Head: Normocephalic.  Cardiovascular: Normal rate and regular rhythm. S1/s2 heard Respiratory: Effort normal, non-labored breathing on RA GI: Soft.  No distension. There is no tenderness.  Skin: WDI  Neuro: Mental Status: Patient is awake, alert, oriented to person, place, month, year, and situation. Patient is able to give a clear and coherent history. No signs of aphasia or neglect Cranial Nerves: II: Visual Fields are full.    III,IV, VI: EOMI without ptosis or diploplia. Pupils are equal, round, and reactive to light. V: Facial sensation is symmetric to light touch VII: Facial movement is symmetric.  VIII: hearing is intact to voice X: Uvula elevates symmetrically XI: Shoulder shrug is symmetric. XII: tongue is midline without atrophy or fasciculations.  Motor: Tone is normal. Bulk is normal. 5/5 strength was  present in all four extremities.  Sensory: Decreased sensation to light touch and temperature in stocking distribution. Special attention to the upper extremities and hands reveals no temperature or fine touch sensation loss.  Deep Tendon Reflexes: 2+ in the biceps and brachioradialis bilaterally.  Plantars: Downgoing bilaterally Cerebellar: FNF and HKS are intact bilaterally Gait: Deferred  ASSESSMENT/PLAN Theodore Schaefer is a 58 y.o. male with history of tobacco abuse presenting with left leg weakness and right hand tingling.  He does not take aspirin.  Works as a Museum/gallery exhibitions officer.  Patient stated that 2 weeks ago he went to [place] and slept in a bed that was hard; the next morning he woke  up and he noted that his left leg was dragging with approximately "every 6th step".  Later, the symptoms went away or were no longer noticed. About one week after onset of the left leg symptoms, his left hand and forearm started tingling, but were not weak. Then, this past Sunday, the leg symptoms came back after sleeping on the same bed that he was in 2 weeks ago.  On Monday he went to work and noted that he still had some weakness in the left leg, and his left hand was still tingling.  At that point he decided he needed to come to the hospital and be evaluated.  Stroke  right lateral thalamus acute infarct embolic secondary to small vessel disease source  CT head :Atrophy with minimal small vessel chronic ischemic changes of deep cerebral white matter. Age-indeterminate lacunar infarct at anterior limb of LEFT internal capsule. Tiny old lacunar infarct RIGHT caudate head.  Code Stroke CT head No acute stroke. Small vessel disease.   MRI  Acute 2 cm lacunar infarct in the lateral Right Thalamus. Noassociated hemorrhage or mass effect. 2. Underlying moderate to severe for age chronic small vessel disease in the cerebral white matter, deep gray matter, and pons.  MRA  No LVO, intracranial  atherosclerotic disease.  2D Echo  Left ventricle: The cavity size was normal. Wall thickness was  normal. Systolic function was normal. The estimated ejection   fraction was in the range of 50% to 55%. Wall motion was normal;   there were no regional wall motion abnormalities. Features are   consistent with a pseudonormal left ventricular filling pattern,   with concomitant abnormal relaxation and increased filling   pressure (grade 2 diastolic dysfunction).  Carotid Doppler: Right Carotid: Velocities in the right ICA are consistent with a 1-39% stenosis. Left Carotid: Velocities in the left ICA are consistent with a 1-39% stenosis.  Vertebrals: Bilateral vertebral arteries demonstrate antegrade flow.  - Mitral valve: There was mild regurgitation.  LDL 130  HgbA1c 5.6  Lovenox for VTE prophylaxis  No antithrombotic prior to admission, now on aspirin 325 mg daily and clopidogrel 75 mg daily.  ASA 325 and Plavix for 3 months, and the  ASA 325mg .   Therapy recommendations:  No PT OT/ SLP f/u,   Disposition:  Home  Hypertension  Stable . Permissive hypertension (OK if < 220/120) but gradually normalize in 5-7 days . Long-term BP goal normotensive  Hyperlipidemia  Home meds:  none,   LDL 130, goal < 70  Add atorvastatin 80 mg daily  Continue statin at discharge  Diabetes type II  HgbA1c 5.6, goal < 7.0  Controlled  Other Stroke Risk Factors  Cigarette smoker advised to stop smoking  ETOH use, advised to drink no more than 2 drink a day  Other Active Problems  Anemia: TIBC and Iron: WNL Ferritin : 22 ( slightly decreased)  Hospital day # 1  Theodore LucksJessica Williams, MSN, NP-C [facility] [phone]  ATTENDING NOTE: I reviewed above note and agree with the assessment and plan. Pt was seen and examined.   58 year old male with history of smoking admitted for 2 weeks of on and off left-sided weakness and tingling.  Currently symptoms all resolved  except left leg feeling weak after long distance walking.  CT showed no acute abnormality but old left caudate infarct.  MRI showed acute right PLIC/thalamus infarct, and chronic left caudate and right CR lacunar infarcts.  MRA showed right A2 and right P1 stenosis.  EF 50 to 55%.  Carotid Doppler negative.  UDS showed positive for THC.  LDL 130 and A1c 5.6.  Neuro exam intact except slight weakness at left foot dorsiflexion.  Patient stroke consistent with small vessel disease given risk factors.  Recommend aggressive stroke risk factor modification, quit smoking and THC.  Patient is willing to quit.  Recommend aspirin 325 and Plavix 75 for 3 months and then aspirin alone given intracranial stenosis at right PCA.  Continue Lipitor 80 for stroke prevention and hyperlipidemia.  Neurology will sign off. Please call with questions. Pt will follow up with stroke clinic NP at [facility] in about 4 weeks. Thanks for the consult.   Marvel Plan, MD PhD Stroke Neurology 08/27/2018 1:50 PM     To contact Stroke Continuity provider, please refer to [email or web address]. After hours, contact General Neurology

## 2018-08-27 NOTE — Care Management Note (Addendum)
Case Management Note  Patient Details  Name: Theodore Schaefer MRN: [id number] Date of Birth: 1961/08/09  Subjective/Objective:    Pt admitted with a CVA. He is from home with spouse and daughter.  Pt recently switched jobs and is in-between insurance. His new insurance starts Feb 1st.  Pt sees Dr Clent Ridges and wants to continue with him.     No issues with transportation.              Action/Plan: No f/u per PT and no DME needs. Pt may need medication assistance depending on d/c meds. CM following.   Addendum: pt discharging home with self care. CM provided him medication coupon card to assist with cost of d/c meds. Pt feels he can afford the meds with the card. He has transport home.  Expected Discharge Date:                  Expected Discharge Plan:     In-House Referral:     Discharge planning Services  CM Consult  Post Acute Care Choice:    Choice offered to:     DME Arranged:    DME Agency:     HH Arranged:    HH Agency:     Status of Service:  In process, will continue to follow  If discussed at Long Length of Stay Meetings, dates discussed:    Additional Comments:  Kermit Balo, RN 08/27/2018, 12:00 PM

## 2018-08-27 NOTE — Progress Notes (Signed)
Discharge instructions reviewed with PT.  Copy of instructions and handouts given to pt, scripts sent to pt's pharmacy electronically by MD.  Pt d/c'd via wheelchair with belongings,            Escorted by unit staff.

## 2018-08-27 NOTE — Plan of Care (Signed)
Pt discharging home, stroke symptoms have resolved. Pt education completed on stroke prevention and signs and symptoms of stroke and calling 911.  Pt given education (handouts and book)  on ischemic stroke, stroke prevention, smoking cessation, and information on his new medications. Pt able verbalize with teach back on education given/reviewed with pt.

## 2018-08-31 ENCOUNTER — Ambulatory Visit (INDEPENDENT_AMBULATORY_CARE_PROVIDER_SITE_OTHER): Payer: Self-pay | Admitting: Family Medicine

## 2018-08-31 ENCOUNTER — Encounter: Payer: Self-pay | Admitting: Family Medicine

## 2018-08-31 VITALS — BP 142/80 | HR 74 | Temp 97.3°F | Wt 162.1 lb

## 2018-08-31 DIAGNOSIS — I639 Cerebral infarction, unspecified: Secondary | ICD-10-CM

## 2018-08-31 NOTE — Progress Notes (Signed)
   Subjective:    Patient ID: Theodore Schaefer, male    DOB: 06-22-1961, 58 y.o.   MRN: [id number]  HPI Here to follow up a hospital stay from 08-25-18 to 08-27-18 for an acute stroke. He presented with weakness in the left leg and tingling in the left hand. An MRI revealed a small lacunar stroke in the right lateral thalamus. An MRA revealed no significant stenoses. His internal carotids both showed 1-39% stenoses. His ECHO was normal. He recovered quickly and now he says he is totally back to baseline. He was to take aspirin 325 mg and Plavix daily for 90 days, and then to stop the Plavix. He does not have an appt with Neurology yet. He is driving his car, and he wants to return to work in a few days.    Review of Systems  Constitutional: Negative.   Respiratory: Negative.   Cardiovascular: Negative.   Neurological: Negative.        Objective:   Physical Exam Constitutional:      Appearance: Normal appearance.  Cardiovascular:     Rate and Rhythm: Normal rate and regular rhythm.     Pulses: Normal pulses.     Heart sounds: Normal heart sounds.  Pulmonary:     Effort: Pulmonary effort is normal.     Breath sounds: Normal breath sounds.  Neurological:     General: No focal deficit present.     Mental Status: He is alert and oriented to person, place, and time.     Cranial Nerves: No cranial nerve deficit.     Sensory: No sensory deficit.     Motor: No weakness.     Coordination: Coordination normal.     Gait: Gait normal.           Assessment & Plan:  He has recovered from a lacunar stroke. He seems to be back to baseline. He will take ASA and Plavix for 90 days, then stop the Plavix. He is in Lipitor. He has reduced his smoking to 2 cigarettes a day. I encouraged him to stop smoking altogether. He will contact [facility] about a follow up appt. He is cleared to return to full work duties as of 09-02-18.  Gershon Crane, MD

## 2018-09-30 ENCOUNTER — Encounter: Payer: Self-pay | Admitting: Family Medicine

## 2018-09-30 ENCOUNTER — Ambulatory Visit (INDEPENDENT_AMBULATORY_CARE_PROVIDER_SITE_OTHER): Payer: BLUE CROSS/BLUE SHIELD | Admitting: Family Medicine

## 2018-09-30 ENCOUNTER — Ambulatory Visit: Payer: Self-pay | Admitting: Family Medicine

## 2018-09-30 ENCOUNTER — Telehealth: Payer: Self-pay | Admitting: Family Medicine

## 2018-09-30 VITALS — BP 124/80 | HR 61 | Temp 98.7°F | Ht 72.0 in | Wt 162.0 lb

## 2018-09-30 DIAGNOSIS — E785 Hyperlipidemia, unspecified: Secondary | ICD-10-CM | POA: Diagnosis not present

## 2018-09-30 NOTE — Progress Notes (Signed)
   Subjective:    Patient ID: Theodore Schaefer, male    DOB: 04/16/1961, 58 y.o.   MRN: [id number]  HPI Here to follow up on high cholesterol. He had a recent stroke and his LDL in January was 130. He was started on Lipitor 80 mg daily. He feels well and has made some dietary changes.    Review of Systems  Constitutional: Negative.   Respiratory: Negative.   Cardiovascular: Negative.   Neurological: Negative.        Objective:   Physical Exam Constitutional:      Appearance: Normal appearance.  Cardiovascular:     Rate and Rhythm: Normal rate and regular rhythm.     Pulses: Normal pulses.     Heart sounds: Normal heart sounds.  Pulmonary:     Effort: Pulmonary effort is normal.     Breath sounds: Normal breath sounds.  Neurological:     General: No focal deficit present.     Mental Status: He is alert and oriented to person, place, and time.           Assessment & Plan:  Dyslipidemia. He will stay on Lipitor and we will check labs in 2 more months.  Gershon Crane, MD

## 2018-09-30 NOTE — Telephone Encounter (Signed)
Pt need a refill on his atorvastatin (LIPITOR) 80 MG  Pharm:[facility] on [address]

## 2018-10-02 MED ORDER — ATORVASTATIN CALCIUM 80 MG PO TABS: 80.0000 mg | ORAL_TABLET | Freq: Every day | ORAL | 11 refills | Status: DC

## 2018-10-02 NOTE — Telephone Encounter (Signed)
Refill has been sent to the pharmacy.  

## 2018-10-05 ENCOUNTER — Telehealth: Payer: Self-pay | Admitting: Family Medicine

## 2018-10-05 NOTE — Telephone Encounter (Signed)
Copied from CRM [id number]. Topic: Quick Communication - Rx Refill/Question >> Oct 05, 2018  3:41 PM Jilda Roche wrote: Medication: clopidogrel (PLAVIX) 75 MG tablet  Has the patient contacted their pharmacy? Yes.   (Agent: If no, request that the patient contact the pharmacy for the refill.) (Agent: If yes, when and what did the pharmacy advise?) Call office  Preferred Pharmacy (with phone number or street name): [facility] [address], [place] - [address] [phone] (Phone) [phone] (Fax)    Agent: Please be advised that RX refills may take up to 3 business days. We ask that you follow-up with your pharmacy.

## 2018-10-05 NOTE — Telephone Encounter (Signed)
[facility] pharmacy and spoke with pharmacy, Selena Batten to confirm that pt had refill available. Prescription was sent to pharmacy on 10/02/18 with additional refills. Attempted to call pt x 2 to notify pt that refill was available at the pharmacy but telephone number is temporarily not in service.

## 2018-10-05 NOTE — Telephone Encounter (Signed)
Pt called back and states that he requested the wrong medication.  Pt doesn't need Plavix at this time he needs atorvastatin (LIPITOR) 80 MG tablet.  Pt would like a call once this has been called in to pharmacy so he knows he can go and pick it up.

## 2018-10-21 ENCOUNTER — Other Ambulatory Visit: Payer: Self-pay

## 2018-10-21 ENCOUNTER — Encounter: Payer: Self-pay | Admitting: Neurology

## 2018-10-21 ENCOUNTER — Ambulatory Visit (INDEPENDENT_AMBULATORY_CARE_PROVIDER_SITE_OTHER): Payer: BLUE CROSS/BLUE SHIELD | Admitting: Neurology

## 2018-10-21 VITALS — BP 128/82 | HR 68 | Ht 72.0 in | Wt 162.4 lb

## 2018-10-21 DIAGNOSIS — R202 Paresthesia of skin: Secondary | ICD-10-CM | POA: Diagnosis not present

## 2018-10-21 DIAGNOSIS — I639 Cerebral infarction, unspecified: Secondary | ICD-10-CM | POA: Diagnosis not present

## 2018-10-21 DIAGNOSIS — I6381 Other cerebral infarction due to occlusion or stenosis of small artery: Secondary | ICD-10-CM

## 2018-10-21 MED ORDER — ATORVASTATIN CALCIUM 80 MG PO TABS: 80.0000 mg | ORAL_TABLET | Freq: Every day | ORAL | 1 refills | Status: DC

## 2018-10-21 NOTE — Progress Notes (Signed)
[facility] [address] [place]. [place] [address] [phone]       OFFICE CONSULT NOTE  [name] Date of Birth:  24-Jan-1961 Medical Record Number:  [id number]   Referring MD: Marvel Plan Reason for Referral: Stroke HPI: [name] is a 58 year old male seen today for initial office consultation visit for stroke.  History is obtained from the patient, review of electronic medical records and have personally reviewed imaging films in PACS.  He presented with subacute complaints of some left leg dragging and numbness for a week prior to admission and subsequently left upper and lower extremity numbness which made him seek medical attention.  On exam his NIH stroke scale was 0 but he had some subjective paresthesias on the left side.  CT scan of the head showed only minimal small vessel changes and age and indeterminate lacunar infarct in the left internal capsule and possibly right caudate head as well.  MRI scan of the brain confirmed an acute 2 cm right lateral thalamic lacunar infarct and confirms old lacunar infarcts as well.  Carotid ultrasound showed no significant extracranial carotid stenosis on either side.  Transthoracic echo showed normal ejection fraction without cardiac source of embolism.  LDL cholesterol was 130 mg percent and hemoglobin A1c was 5.6.  Urine drug screen was positive for marijuana.  Patient was also a smoker.  Patient was counseled to quit smoking and started on aspirin and Plavix for 3 weeks which he has taken.  He is presently on aspirin alone tolerating it well without bleeding or bruising.  He was on Lipitor 80 mg which he took for a month and ran out and he has not yet refilled his prescription.  He states left-sided numbness is recovered completely within a few days after his hospitalization and he has no residual complaints or new symptoms.  He has cut back smoking significantly but still smokes 3 to 4 cigarettes a day and is willing to  completely quit it soon.  He has no prior history of strokes TIAs seizures or other neurological problems.  There is no family history of strokes either.  ROS:   14 system review of systems is positive for excessive sweating, numbness and all other systems negative  PMH: History reviewed. No pertinent past medical history.  Social History:  Social History   Socioeconomic History   Marital status: Single    Spouse name: Not on file   Number of children: Not on file   Years of education: Not on file   Highest education level: Not on file  Occupational History   Not on file  Social Needs   Financial resource strain: Not on file   Food insecurity:    Worry: Not on file    Inability: Not on file   Transportation needs:    Medical: Not on file    Non-medical: Not on file  Tobacco Use   Smoking status: Current Some Day Smoker    Packs/day: 0.25    Types: Cigarettes   Smokeless tobacco: Never Used   Tobacco comment: 5 cigarettes daily   Substance and Sexual Activity   Alcohol use: Yes    Comment: 12 pack a beer a week   Drug use: No   Sexual activity: Not on file  Lifestyle   Physical activity:    Days per week: Not on file    Minutes per session: Not on file   Stress: Not on file  Relationships   Social connections:  Talks on phone: Not on file    Gets together: Not on file    Attends religious service: Not on file    Active member of club or organization: Not on file    Attends meetings of clubs or organizations: Not on file    Relationship status: Not on file   Intimate partner violence:    Fear of current or ex partner: Not on file    Emotionally abused: Not on file    Physically abused: Not on file    Forced sexual activity: Not on file  Other Topics Concern   Not on file  Social History Narrative   Not on file    Medications:   Current Outpatient Medications on File Prior to Visit  Medication Sig Dispense Refill   aspirin 325 MG  tablet Take 1 tablet (325 mg total) by mouth daily. 30 tablet 11   clopidogrel (PLAVIX) 75 MG tablet Take 1 tablet (75 mg total) by mouth daily. 90 tablet 0   ferrous sulfate 325 (65 FE) MG tablet Take 1 tablet (325 mg total) by mouth 2 (two) times daily with a meal. 60 tablet 11   No current facility-administered medications on file prior to visit.     Allergies:  No Known Allergies  Physical Exam General: well developed, well nourished middle-aged African-American male not in distress, seated, in no evident distress Head: head normocephalic and atraumatic.   Neck: supple with no carotid or supraclavicular bruits Cardiovascular: regular rate and rhythm, no murmurs Musculoskeletal: no deformity Skin:  no rash/petichiae Vascular:  Normal pulses all extremities  Neurologic Exam Mental Status: Awake and fully alert. Oriented to place and time. Recent and remote memory intact. Attention span, concentration and fund of knowledge appropriate. Mood and affect appropriate.  Cranial Nerves: Fundoscopic exam reveals sharp disc margins. Pupils equal, briskly reactive to light. Extraocular movements full without nystagmus. Visual fields full to confrontation. Hearing intact. Facial sensation intact. Face, tongue, palate moves normally and symmetrically.  Motor: Normal bulk and tone. Normal strength in all tested extremity muscles. Sensory.: intact to touch , pinprick , position and vibratory sensation.  Coordination: Rapid alternating movements normal in all extremities. Finger-to-nose and heel-to-shin performed accurately bilaterally. Gait and Station: Arises from chair without difficulty. Stance is normal. Gait demonstrates normal stride length and balance . Able to heel, toe and tandem walk without difficulty.  Reflexes: 1+ and symmetric. Toes downgoing.   NIHSS  0 Modified Rankin 1  ASSESSMENT: 58 year old   male with a right thalamic lacunar infarct in January 2020 from which she is  recovered quite well.  Vascular risk factors of hyperlipidemia and smoking.     PLAN: I had a long d/w patient about his recent lacunar stroke, risk for recurrent stroke/TIAs, personally independently reviewed imaging studies and stroke evaluation results and answered questions.Continue aspirin 325 mg daily  for secondary stroke prevention and maintain strict control of hypertension with blood pressure goal below 130/90, diabetes with hemoglobin A1c goal below 6.5% and lipids with LDL cholesterol goal below 70 mg/dL. I also advised the patient to eat a healthy diet with plenty of whole grains, cereals, fruits and vegetables, exercise regularly and maintain ideal body weight.  I have counseled him to quit smoking completely and have given him a refill of his Lipitor and advised him to get subsequent refills from his medical doctor.  Greater than 50% time during this 45-minute consultation visit was spent on counseling and coordination of care about his thalamic lacunar  infarct and discussion about stroke prevention and treatment and answering questions followup in the future with my nurse practitioner Shanda Bumps in 3 months or call earlier if necessary. Delia Heady, MD  [facility] [address] [place], [place] [address]  Phone [phone] Fax [phone] Note: This document was prepared with digital dictation and possible smart phrase technology. Any transcriptional errors that result from this process are unintentional.

## 2018-10-21 NOTE — Patient Instructions (Signed)
I had a long d/w patient about his recent lacunar stroke, risk for recurrent stroke/TIAs, personally independently reviewed imaging studies and stroke evaluation results and answered questions.Continue aspirin 325 mg daily  for secondary stroke prevention and maintain strict control of hypertension with blood pressure goal below 130/90, diabetes with hemoglobin A1c goal below 6.5% and lipids with LDL cholesterol goal below 70 mg/dL. I also advised the patient to eat a healthy diet with plenty of whole grains, cereals, fruits and vegetables, exercise regularly and maintain ideal body weight.  I have counseled him to quit smoking completely and have given him a refill of his Lipitor and advised him to get subsequent refills from his medical doctor.  Followup in the future with my nurse practitioner Shanda Bumps in 3 months or call earlier if necessary.  Stroke Prevention Some medical conditions and behaviors are associated with a higher chance of having a stroke. You can help prevent a stroke by making nutrition, lifestyle, and other changes, including managing any medical conditions you may have. What nutrition changes can be made?   Eat healthy foods. You can do this by: ? Choosing foods high in fiber, such as fresh fruits and vegetables and whole grains. ? Eating at least 5 or more servings of fruits and vegetables a day. Try to fill half of your plate at each meal with fruits and vegetables. ? Choosing lean protein foods, such as lean cuts of meat, poultry without skin, fish, tofu, beans, and nuts. ? Eating low-fat dairy products. ? Avoiding foods that are high in salt (sodium). This can help lower blood pressure. ? Avoiding foods that have saturated fat, trans fat, and cholesterol. This can help prevent high cholesterol. ? Avoiding processed and premade foods.  Follow your health care provider's specific guidelines for losing weight, controlling high blood pressure (hypertension), lowering high  cholesterol, and managing diabetes. These may include: ? Reducing your daily calorie intake. ? Limiting your daily sodium intake to 1,500 milligrams (mg). ? Using only healthy fats for cooking, such as olive oil, canola oil, or sunflower oil. ? Counting your daily carbohydrate intake. What lifestyle changes can be made?  Maintain a healthy weight. Talk to your health care provider about your ideal weight.  Get at least 30 minutes of moderate physical activity at least 5 days a week. Moderate activity includes brisk walking, biking, and swimming.  Do not use any products that contain nicotine or tobacco, such as cigarettes and e-cigarettes. If you need help quitting, ask your health care provider. It may also be helpful to avoid exposure to secondhand smoke.  Limit alcohol intake to no more than 1 drink a day for nonpregnant women and 2 drinks a day for men. One drink equals 12 oz of beer, 5 oz of wine, or 1 oz of hard liquor.  Stop any illegal drug use.  Avoid taking birth control pills. Talk to your health care provider about the risks of taking birth control pills if: ? You are over 45 years old. ? You smoke. ? You get migraines. ? You have ever had a blood clot. What other changes can be made?  Manage your cholesterol levels. ? Eating a healthy diet is important for preventing high cholesterol. If cholesterol cannot be managed through diet alone, you may also need to take medicines. ? Take any prescribed medicines to control your cholesterol as told by your health care provider.  Manage your diabetes. ? Eating a healthy diet and exercising regularly are important parts of  managing your blood sugar. If your blood sugar cannot be managed through diet and exercise, you may need to take medicines. ? Take any prescribed medicines to control your diabetes as told by your health care provider.  Control your hypertension. ? To reduce your risk of stroke, try to keep your blood pressure  below 130/80. ? Eating a healthy diet and exercising regularly are an important part of controlling your blood pressure. If your blood pressure cannot be managed through diet and exercise, you may need to take medicines. ? Take any prescribed medicines to control hypertension as told by your health care provider. ? Ask your health care provider if you should monitor your blood pressure at home. ? Have your blood pressure checked every year, even if your blood pressure is normal. Blood pressure increases with age and some medical conditions.  Get evaluated for sleep disorders (sleep apnea). Talk to your health care provider about getting a sleep evaluation if you snore a lot or have excessive sleepiness.  Take over-the-counter and prescription medicines only as told by your health care provider. Aspirin or blood thinners (antiplatelets or anticoagulants) may be recommended to reduce your risk of forming blood clots that can lead to stroke.  Make sure that any other medical conditions you have, such as atrial fibrillation or atherosclerosis, are managed. What are the warning signs of a stroke? The warning signs of a stroke can be easily remembered as BEFAST.  B is for balance. Signs include: ? Dizziness. ? Loss of balance or coordination. ? Sudden trouble walking.  E is for eyes. Signs include: ? A sudden change in vision. ? Trouble seeing.  F is for face. Signs include: ? Sudden weakness or numbness of the face. ? The face or eyelid drooping to one side.  A is for arms. Signs include: ? Sudden weakness or numbness of the arm, usually on one side of the body.  S is for speech. Signs include: ? Trouble speaking (aphasia). ? Trouble understanding.  T is for time. ? These symptoms may represent a serious problem that is an emergency. Do not wait to see if the symptoms will go away. Get medical help right away. Call your local emergency services (911 in the [place] Do not drive yourself  to the hospital.  Other signs of stroke may include: ? A sudden, severe headache with no known cause. ? Nausea or vomiting. ? Seizure. Where to find more information For more information, visit:  American Stroke Association: www.strokeassociation.org  National Stroke Association: www.stroke.org Summary  You can prevent a stroke by eating healthy, exercising, not smoking, limiting alcohol intake, and managing any medical conditions you may have.  Do not use any products that contain nicotine or tobacco, such as cigarettes and e-cigarettes. If you need help quitting, ask your health care provider. It may also be helpful to avoid exposure to secondhand smoke.  Remember BEFAST for warning signs of stroke. Get help right away if you or a loved one has any of these signs. This information is not intended to replace advice given to you by your health care provider. Make sure you discuss any questions you have with your health care provider. Document Released: 09/05/2004 Document Revised: 09/03/2016 Document Reviewed: 09/03/2016 Elsevier Interactive Patient Education  2019 [employer].  Smoking Tobacco Information, Adult Smoking tobacco can be harmful to your health. Tobacco contains a poisonous (toxic), colorless chemical called nicotine. Nicotine is addictive. It changes the brain and can make it hard to stop  smoking. Tobacco also has other toxic chemicals that can hurt your body and raise your risk of many cancers. How can smoking tobacco affect me? Smoking tobacco puts you at risk for:  Cancer. Smoking is most commonly associated with lung cancer, but can also lead to cancer in other parts of the body.  Chronic obstructive pulmonary disease (COPD). This is a long-term lung condition that makes it hard to breathe. It also gets worse over time.  High blood pressure (hypertension), heart disease, stroke, or heart attack.  Lung infections, such as pneumonia.  Cataracts. This is when the  lenses in the eyes become clouded.  Digestive problems. This may include peptic ulcers, heartburn, and gastroesophageal reflux disease (GERD).  Oral health problems, such as gum disease and tooth loss.  Loss of taste and smell. Smoking can affect your appearance by causing:  Wrinkles.  Yellow or stained teeth, fingers, and fingernails. Smoking tobacco can also affect your social life, because:  It may be challenging to find places to smoke when away from home. Many workplaces, [employer], hotels, and public places are tobacco-free.  Smoking is expensive. This is due to the cost of tobacco and the long-term costs of treating health problems from smoking.  Secondhand smoke may affect those around you. Secondhand smoke can cause lung cancer, breathing problems, and heart disease. Children of smokers have a higher risk for: ? Sudden infant death syndrome (SIDS). ? Ear infections. ? Lung infections. If you currently smoke tobacco, quitting now can help you:  Lead a longer and healthier life.  Look, smell, breathe, and feel better over time.  Save money.  Protect others from the harms of secondhand smoke. What actions can I take to prevent health problems? Quit smoking   Do not start smoking. Quit if you already do.  Make a plan to quit smoking and commit to it. Look for programs to help you and ask your health care provider for recommendations and ideas.  Set a date and write down all the reasons you want to quit.  Let your friends and family know you are quitting so they can help and support you. Consider finding friends who also want to quit. It can be easier to quit with someone else, so that you can support each other.  Talk with your health care provider about using nicotine replacement medicines to help you quit, such as gum, lozenges, patches, sprays, or pills.  Do not replace cigarette smoking with electronic cigarettes, which are commonly called e-cigarettes. The  safety of e-cigarettes is not known, and some may contain harmful chemicals.  If you try to quit but return to smoking, stay positive. It is common to slip up when you first quit, so take it one day at a time.  Be prepared for cravings. When you feel the urge to smoke, chew gum or suck on hard candy. Lifestyle  Stay busy and take care of your body.  Drink enough fluid to keep your urine pale yellow.  Get plenty of exercise and eat a healthy diet. This can help prevent weight gain after quitting.  Monitor your eating habits. Quitting smoking can cause you to have a larger appetite than when you smoke.  Find ways to relax. Go out with friends or family to a movie or a restaurant where people do not smoke.  Ask your health care provider about having regular tests (screenings) to check for cancer. This may include blood tests, imaging tests, and other tests.  Find ways to  manage your stress, such as meditation, yoga, or exercise. Where to find support To get support to quit smoking, consider:  Asking your health care provider for more information and resources.  Taking classes to learn more about quitting smoking.  Looking for local organizations that offer resources about quitting smoking.  Joining a support group for people who want to quit smoking in your local community.  Calling the smokefree.gov counselor helpline: 1-800-Quit-Now 509-708-0362) Where to find more information You may find more information about quitting smoking from:  HelpGuide.org: www.helpguide.org  [employer]: smokefree.gov  American Lung Association: www.lung.org Contact a health care provider if you:  Have problems breathing.  Notice that your lips, nose, or fingers turn blue.  Have chest pain.  Are coughing up blood.  Feel faint or you pass out.  Have other health changes that cause you to worry. Summary  Smoking tobacco can negatively affect your health, the health of those around  you, your finances, and your social life.  Do not start smoking. Quit if you already do. If you need help quitting, ask your health care provider.  Think about joining a support group for people who want to quit smoking in your local community. There are many effective programs that will help you to quit this behavior. This information is not intended to replace advice given to you by your health care provider. Make sure you discuss any questions you have with your health care provider. Document Released: 08/13/2016 Document Revised: 09/17/2017 Document Reviewed: 08/13/2016 Elsevier Interactive Patient Education  2019 [employer].

## 2018-12-01 ENCOUNTER — Other Ambulatory Visit: Payer: Self-pay

## 2018-12-01 ENCOUNTER — Ambulatory Visit: Payer: BLUE CROSS/BLUE SHIELD | Admitting: Family Medicine

## 2018-12-08 ENCOUNTER — Ambulatory Visit: Payer: BLUE CROSS/BLUE SHIELD | Admitting: Family Medicine

## 2018-12-17 ENCOUNTER — Other Ambulatory Visit: Payer: Self-pay | Admitting: Family Medicine

## 2018-12-17 MED ORDER — CLOPIDOGREL BISULFATE 75 MG PO TABS: 75.0000 mg | ORAL_TABLET | Freq: Every day | ORAL | 1 refills | Status: DC

## 2019-01-21 ENCOUNTER — Ambulatory Visit: Payer: BLUE CROSS/BLUE SHIELD | Admitting: Adult Health

## 2019-02-11 DIAGNOSIS — Z20828 Contact with and (suspected) exposure to other viral communicable diseases: Secondary | ICD-10-CM | POA: Diagnosis not present

## 2019-03-04 DIAGNOSIS — Z20828 Contact with and (suspected) exposure to other viral communicable diseases: Secondary | ICD-10-CM | POA: Diagnosis not present

## 2019-03-16 DIAGNOSIS — H524 Presbyopia: Secondary | ICD-10-CM | POA: Diagnosis not present

## 2019-05-05 ENCOUNTER — Other Ambulatory Visit: Payer: Self-pay | Admitting: Family Medicine

## 2019-06-01 ENCOUNTER — Telehealth: Payer: Self-pay | Admitting: Family Medicine

## 2019-06-01 NOTE — Telephone Encounter (Signed)
Copied from [place] [id number]. Topic: General - Other >> Jun 01, 2019 12:22 PM Keene Breath wrote: Reason for CRM: Patient called to ask the doctor to send him in a refill for the sildenafil medication.  Please advise and call patient to discuss.  CB# [phone]

## 2019-06-02 NOTE — Telephone Encounter (Signed)
Left message to return phone call.

## 2019-06-02 NOTE — Telephone Encounter (Signed)
Pt returned call to [facility]. Pt requests call back

## 2019-06-03 ENCOUNTER — Other Ambulatory Visit: Payer: Self-pay | Admitting: Family Medicine

## 2019-06-03 MED ORDER — SILDENAFIL CITRATE 100 MG PO TABS: 100.0000 mg | ORAL_TABLET | ORAL | 5 refills | Status: DC | PRN

## 2019-06-03 NOTE — Telephone Encounter (Signed)
Noted! Pt notified of update.  

## 2019-06-03 NOTE — Telephone Encounter (Signed)
I sent in refill for him.

## 2019-06-03 NOTE — Telephone Encounter (Signed)
Please advise   Pt last refill was 04/2019. Pt stated he doesn't know why it is not on his med list. He stated he hasnt been using it but now he needs it. I do not see anything in notes where Dr.Fry d/c medication.  Okay to refill or should pt schedule a visit? Pt does not want to wait until next weekend to have this filled.  Please advise

## 2021-04-29 ENCOUNTER — Emergency Department (HOSPITAL_COMMUNITY)
Admission: EM | Admit: 2021-04-29 | Discharge: 2021-04-29 | Discharge status: Against Medical Advice | Payer: BLUE CROSS/BLUE SHIELD

## 2021-04-29 NOTE — ED Notes (Signed)
Pt called for triage no response unable to locate

## 2021-04-29 NOTE — ED Notes (Signed)
No answer for triage.

## 2021-04-30 ENCOUNTER — Encounter (HOSPITAL_COMMUNITY): Payer: Self-pay | Admitting: Emergency Medicine

## 2021-04-30 ENCOUNTER — Emergency Department (HOSPITAL_COMMUNITY)
Admission: EM | Admit: 2021-04-30 | Discharge: 2021-04-30 | Disposition: A | Discharge status: Home / Self Care | Payer: Self-pay | Attending: Physician Assistant | Admitting: Physician Assistant

## 2021-04-30 ENCOUNTER — Emergency Department (HOSPITAL_COMMUNITY): Payer: Self-pay

## 2021-04-30 ENCOUNTER — Other Ambulatory Visit: Payer: Self-pay

## 2021-04-30 DIAGNOSIS — Z5321 Procedure and treatment not carried out due to patient leaving prior to being seen by health care provider: Secondary | ICD-10-CM | POA: Insufficient documentation

## 2021-04-30 DIAGNOSIS — M545 Low back pain, unspecified: Secondary | ICD-10-CM | POA: Insufficient documentation

## 2021-04-30 DIAGNOSIS — M546 Pain in thoracic spine: Secondary | ICD-10-CM | POA: Insufficient documentation

## 2021-04-30 DIAGNOSIS — Y92410 Unspecified street and highway as the place of occurrence of the external cause: Secondary | ICD-10-CM | POA: Insufficient documentation

## 2021-04-30 NOTE — ED Notes (Signed)
Called pt for vitals, no response.
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
## 2022-02-22 ENCOUNTER — Ambulatory Visit (INDEPENDENT_AMBULATORY_CARE_PROVIDER_SITE_OTHER): Payer: Self-pay

## 2022-02-22 ENCOUNTER — Ambulatory Visit (HOSPITAL_COMMUNITY)
Admission: EM | Admit: 2022-02-22 | Discharge: 2022-02-22 | Disposition: A | Discharge status: Home / Self Care | Payer: Self-pay | Attending: Family Medicine | Admitting: Family Medicine

## 2022-02-22 ENCOUNTER — Encounter (HOSPITAL_COMMUNITY): Payer: Self-pay

## 2022-02-22 DIAGNOSIS — M5442 Lumbago with sciatica, left side: Secondary | ICD-10-CM

## 2022-02-22 DIAGNOSIS — G8929 Other chronic pain: Secondary | ICD-10-CM

## 2022-02-22 DIAGNOSIS — M545 Low back pain, unspecified: Secondary | ICD-10-CM

## 2022-02-22 MED ORDER — KETOROLAC TROMETHAMINE 30 MG/ML IJ SOLN
INTRAMUSCULAR | Status: AC
  Filled 2022-02-22: qty 1

## 2022-02-22 MED ORDER — DEXAMETHASONE SODIUM PHOSPHATE 10 MG/ML IJ SOLN
INTRAMUSCULAR | Status: AC
  Filled 2022-02-22: qty 1

## 2022-02-22 MED ORDER — PREDNISONE 20 MG PO TABS: 40.0000 mg | ORAL_TABLET | Freq: Every day | ORAL | 0 refills | Status: DC

## 2022-02-22 MED ORDER — KETOROLAC TROMETHAMINE 30 MG/ML IJ SOLN
15.0000 mg | Freq: Once | INTRAMUSCULAR | Status: AC
  Administered 2022-02-22: 15 mg via INTRAMUSCULAR

## 2022-02-22 MED ORDER — TIZANIDINE HCL 4 MG PO TABS: 4.0000 mg | ORAL_TABLET | Freq: Two times a day (BID) | ORAL | 0 refills | Status: DC | PRN

## 2022-02-22 MED ORDER — DEXAMETHASONE SODIUM PHOSPHATE 10 MG/ML IJ SOLN
10.0000 mg | Freq: Once | INTRAMUSCULAR | Status: AC
  Administered 2022-02-22: 10 mg via INTRAMUSCULAR

## 2022-02-22 NOTE — ED Provider Notes (Signed)
[facility]    CSN: [id number] Arrival date & time: 02/22/22  4081      History   Chief Complaint Chief Complaint  Patient presents with   Back Pain    HPI Theodore Schaefer is a 61 y.o. male.   HPI Patient with a history of degenerative disc disease presents today for evaluation of  Low back pain with pain radiating down to his left leg.    History reviewed. No pertinent past medical history.  Patient Active Problem List   Diagnosis Date Noted   Dyslipidemia 09/30/2018   Anemia 08/26/2018   Tobacco use 08/26/2018   Elevated blood pressure reading 08/26/2018   Acute CVA (cerebrovascular accident) (HCC) 08/25/2018    Past Surgical History:  Procedure Laterality Date   ACHILLES TENDON REPAIR Left    as a teenager    COLONOSCOPY  10/13/2017   per Dr. Adela Lank, int hemorrhoids, no polyps, repeat 10 yrs        Home Medications    Prior to Admission medications   Medication Sig Start Date End Date Taking? Authorizing Provider  aspirin 325 MG tablet Take 1 tablet (325 mg total) by mouth daily. 08/28/18   Marguerita Merles Latif, DO  atorvastatin (LIPITOR) 80 MG tablet Take 1 tablet (80 mg total) by mouth daily at 6 PM. 10/21/18   Micki Riley, MD  clopidogrel (PLAVIX) 75 MG tablet Take 1 tablet (75 mg total) by mouth daily. 12/17/18   Nelwyn Salisbury, MD  ferrous sulfate 325 (65 FE) MG tablet Take 1 tablet (325 mg total) by mouth 2 (two) times daily with a meal. 09/01/17   Nelwyn Salisbury, MD  sildenafil (VIAGRA) 100 MG tablet Take 1 tablet (100 mg total) by mouth as needed for erectile dysfunction. 06/03/19   Wynn Banker, MD    Family History Family History  Problem Relation Age of Onset   Cancer Mother    Heart disease Father    Hypertension Father    Heart disease Sister    Heart disease Brother    Hypertension Brother    Colon cancer Neg Hx    Colon polyps Neg Hx    Esophageal cancer Neg Hx    Rectal cancer Neg Hx    Stomach cancer Neg  Hx     Social History Social History   Tobacco Use   Smoking status: Some Days    Packs/day: 0.25    Types: Cigarettes   Smokeless tobacco: Never   Tobacco comments:    5 cigarettes daily   Vaping Use   Vaping Use: Never used  Substance Use Topics   Alcohol use: Yes    Comment: 12 pack a beer a week   Drug use: No     Allergies   Patient has no known allergies.   Review of Systems Review of Systems   Physical Exam Triage Vital Signs ED Triage Vitals [02/22/22 1126]  Enc Vitals Group     BP (!) 146/79     Pulse Rate 84     Resp 18     Temp 98.2 F (36.8 C)     Temp Source Oral     SpO2 96 %     Weight      Height      Head Circumference      Peak Flow      Pain Score 8     Pain Loc      Pain Edu?  Excl. in GC?    No data found.  Updated Vital Signs BP (!) 146/79 (BP Location: Left Arm)   Pulse 84   Temp 98.2 F (36.8 C) (Oral)   Resp 18   SpO2 96%   Visual Acuity Right Eye Distance:   Left Eye Distance:   Bilateral Distance:    Right Eye Near:   Left Eye Near:    Bilateral Near:     Physical Exam   UC Treatments / Results  Labs (all labs ordered are listed, but only abnormal results are displayed) Labs Reviewed - No data to display  EKG   Radiology No results found.  Procedures Procedures (including critical care time)  Medications Ordered in UC Medications - No data to display  Initial Impression / Assessment and Plan / UC Course  I have reviewed the triage vital signs and the nursing notes.  Pertinent labs & imaging results that were available during my care of the patient were reviewed by me and considered in my medical decision making (see chart for details).     *** Final Clinical Impressions(s) / UC Diagnoses   Final diagnoses:  None   Discharge Instructions   None    ED Prescriptions   None    PDMP not reviewed this encounter.

## 2022-02-22 NOTE — ED Triage Notes (Signed)
Pt c/o chronic lower back pain. C/o lower back pain radiating down lt leg x1wk. States d/t pain last night lost his balance and fell to the floor. Denies taking meds for pain.

## 2022-02-22 NOTE — Discharge Instructions (Addendum)
Your x-ray shows degenerative low back disease which is consistent with that of arthritis. Recommend applications of heat. I have prescribed you 5 days of prednisone start taking tomorrow-make sure you take with food to avoid upset stomach. Recommend discussing with primary care provider about referral to a spine specialist if you continue to have recurrent episodes of low back pain

## 2022-02-26 ENCOUNTER — Telehealth: Payer: Self-pay | Admitting: Family Medicine

## 2022-02-26 NOTE — Telephone Encounter (Signed)
Pt last seen dr Clent Ridges jan 2020 and would like to re-est with dr fry. Pt went to er for back pain

## 2022-02-27 NOTE — Telephone Encounter (Signed)
Yes I can see him again  

## 2022-02-27 NOTE — Telephone Encounter (Signed)
Left detailed message for pt to call the office and schedule New pt appointment

## 2022-02-28 NOTE — Telephone Encounter (Signed)
Pt has been sch for 03-04-2022

## 2022-03-01 NOTE — Telephone Encounter (Signed)
Notes

## 2022-03-04 ENCOUNTER — Encounter: Payer: Self-pay | Admitting: Family Medicine

## 2022-03-04 ENCOUNTER — Ambulatory Visit (INDEPENDENT_AMBULATORY_CARE_PROVIDER_SITE_OTHER): Payer: Self-pay | Admitting: Family Medicine

## 2022-03-04 VITALS — BP 120/82 | HR 67 | Temp 98.7°F | Ht 72.0 in | Wt 171.5 lb

## 2022-03-04 DIAGNOSIS — M5442 Lumbago with sciatica, left side: Secondary | ICD-10-CM

## 2022-03-04 DIAGNOSIS — Z8673 Personal history of transient ischemic attack (TIA), and cerebral infarction without residual deficits: Secondary | ICD-10-CM

## 2022-03-04 DIAGNOSIS — G8929 Other chronic pain: Secondary | ICD-10-CM

## 2022-03-04 MED ORDER — SILDENAFIL CITRATE 100 MG PO TABS: 100.0000 mg | ORAL_TABLET | ORAL | 11 refills | Status: DC | PRN

## 2022-03-04 MED ORDER — ASPIRIN 81 MG PO TBEC: 81.0000 mg | DELAYED_RELEASE_TABLET | Freq: Every day | ORAL | 0 refills | Status: AC

## 2022-03-04 MED ORDER — MELOXICAM 15 MG PO TABS: 15.0000 mg | ORAL_TABLET | Freq: Every day | ORAL | 5 refills | Status: DC

## 2022-03-04 NOTE — Progress Notes (Signed)
   Subjective:    Patient ID: Theodore Schaefer, male    DOB: [birth year], 61 y.o.   MRN: [id number]  HPI Here to re-establish after an absence of 3 and 1/2 years, and to discuss his low back pain.  He had a stroke in 2020, and he has completely recovered from this. He took ASA and Plavix for 3 months, and then stopped the Plavix as instructed. He was to have stayed on ASA after that, but he stopped taking this several years ago. In fact he has stopped taking all medications except for Sildenafil. Then about one year ago he developed a constant low back pain that has slowly gotten worse over time. No hx of trauma. The pain is sharp and involves both sides of the lower back. The legs had not been involved, but over the last month or so he now has pain radiating down the left leg. No numbness or weakness in the legs. He takes Ibuprofen and applies heat with little relief. He was even seen in the ED on 02-22-22 where he had Xrays of the lumbar spine,. This showed a degenerative disc at L4-L5 but not much else. He was given Prednisone and Zanaflex, but these have not helped.   Review of Systems  Constitutional: Negative.   Respiratory: Negative.    Cardiovascular: Negative.   Musculoskeletal:  Positive for back pain.  Neurological: Negative.        Objective:   Physical Exam Constitutional:      Appearance: Normal appearance.  Cardiovascular:     Rate and Rhythm: Normal rate and regular rhythm.     Pulses: Normal pulses.     Heart sounds: Normal heart sounds.  Pulmonary:     Effort: Pulmonary effort is normal.     Breath sounds: Normal breath sounds.  Musculoskeletal:     Right lower leg: No edema.     Left lower leg: No edema.     Comments: He is tender directly over the lower spine, but not over the sciatic notches. He has pain on extension of the lower spine but not on flexion. SLR are negative on both sides.   Neurological:     General: No focal deficit present.     Mental Status: He is  alert and oriented to person, place, and time.           Assessment & Plan:  Intro visit for this patient with a hx of stroke, currently not on any medications. He did agree to get back on a daily aspirin, but I think 81 mg would be sufficient. He is hesitant to take the statin. He also has chronic low  back pain that has gotten worse. We will start him on Meloxicam 15 mg daily. Set up an MRI of the lumbar spine. He will also set up a well exam sometime soon with fasting labs.  Gershon Crane, MD

## 2022-03-08 ENCOUNTER — Ambulatory Visit (INDEPENDENT_AMBULATORY_CARE_PROVIDER_SITE_OTHER): Payer: Self-pay | Admitting: Adult Health

## 2022-03-08 ENCOUNTER — Encounter: Payer: Self-pay | Admitting: Adult Health

## 2022-03-08 VITALS — BP 110/70 | HR 83 | Temp 98.5°F | Ht 72.0 in | Wt 167.0 lb

## 2022-03-08 DIAGNOSIS — M5442 Lumbago with sciatica, left side: Secondary | ICD-10-CM

## 2022-03-08 DIAGNOSIS — G8929 Other chronic pain: Secondary | ICD-10-CM

## 2022-03-08 NOTE — Progress Notes (Signed)
Subjective:    Patient ID: Theodore Schaefer, male    DOB: September 04, 1960, 61 y.o.   MRN: [id number]  HPI 61 year old male who  has a past medical history of Arthritis and Stroke (HCC).  He presents to the office today for follow up regarding low back pain. He was seen by his PCP 4 days ago for chronic low back pain that has been present for over a year. He had an xray done at the beginning of this month that showed degenerative changes. His PCP ordered an MRI and prescribed mobic 15 mg daily. He reports about a 25% improvement in his pain with mobic over the last few days but has not heard anything about the MRI yet.    Review of Systems See HPI   Past Medical History:  Diagnosis Date   Arthritis    Stroke [facility])     Social History   Socioeconomic History   Marital status: Single    Spouse name: Not on file   Number of children: Not on file   Years of education: Not on file   Highest education level: Not on file  Occupational History   Not on file  Tobacco Use   Smoking status: Some Days    Packs/day: 0.50    Types: Cigarettes   Smokeless tobacco: Never   Tobacco comments:    5 cigarettes daily   Vaping Use   Vaping Use: Never used  Substance and Sexual Activity   Alcohol use: Yes    Comment: 12 pack a beer a week   Drug use: No   Sexual activity: Not on file  Other Topics Concern   Not on file  Social History Narrative   Not on file   Social Determinants of Health   Financial Resource Strain: Not on file  Food Insecurity: Not on file  Transportation Needs: Not on file  Physical Activity: Not on file  Stress: Not on file  Social Connections: Not on file  Intimate Partner Violence: Not on file    Past Surgical History:  Procedure Laterality Date   ACHILLES TENDON REPAIR Left    as a teenager    COLONOSCOPY  10/13/2017   per Dr. Adela Lank, int hemorrhoids, no polyps, repeat 10 yrs    left ankle surgery      Family History  Problem Relation Age of Onset    Cancer Mother    Heart disease Father    Hypertension Father    Heart disease Sister    Heart disease Brother    Hypertension Brother    Colon cancer Neg Hx    Colon polyps Neg Hx    Esophageal cancer Neg Hx    Rectal cancer Neg Hx    Stomach cancer Neg Hx     No Known Allergies  Current Outpatient Medications on File Prior to Visit  Medication Sig Dispense Refill   aspirin EC 81 MG tablet Take 1 tablet (81 mg total) by mouth daily. Swallow whole. 30 tablet 0   meloxicam (MOBIC) 15 MG tablet Take 1 tablet (15 mg total) by mouth daily. 30 tablet 5   sildenafil (VIAGRA) 100 MG tablet Take 1 tablet (100 mg total) by mouth as needed for erectile dysfunction. 10 tablet 11   No current facility-administered medications on file prior to visit.    BP 110/70   Pulse 83   Temp 98.5 F (36.9 C) (Oral)   Ht 6' (1.829 m)   Wt 167 lb (  75.8 kg)   SpO2 97%   BMI 22.65 kg/m       Objective:   Physical Exam Vitals and nursing note reviewed.  Constitutional:      Appearance: Normal appearance.  Skin:    General: Skin is dry.  Neurological:     General: No focal deficit present.     Mental Status: He is alert and oriented to person, place, and time.  Psychiatric:        Mood and Affect: Mood normal.        Behavior: Behavior normal.        Thought Content: Thought content normal.        Judgment: Judgment normal.       Assessment & Plan:  1. Chronic bilateral low back pain with left-sided sciatica - Advised that is may take a week or two to get scheduled for MRI  - Phone number for [facility] given and he can try and call and schedule  - Continue with Mobic 15  - Follow up with PCP as needed  Shirline Frees, NP

## 2022-03-08 NOTE — Patient Instructions (Signed)
Please call [facility]  [address]  [phone] Open ? Closes 7?PM  Just tell them you are trying to schedule your MRI

## 2022-03-15 NOTE — Addendum Note (Signed)
Addended by: Gershon Crane A on: 03/15/2022 12:19 PM   Modules accepted: Orders

## 2022-03-16 ENCOUNTER — Other Ambulatory Visit: Payer: Self-pay

## 2022-03-21 ENCOUNTER — Telehealth: Payer: Self-pay

## 2022-03-21 NOTE — Telephone Encounter (Signed)
Attempted to call pt regarding Dr Clent Ridges advise, no option of leaving  voice mail on pt both phone numbers in pt chart. Pt has appointment scheduled with Dr Clent Ridges on 03/22/2022

## 2022-03-22 ENCOUNTER — Encounter: Payer: Self-pay | Admitting: Family Medicine

## 2022-03-22 ENCOUNTER — Ambulatory Visit (INDEPENDENT_AMBULATORY_CARE_PROVIDER_SITE_OTHER): Payer: Commercial Managed Care - HMO | Admitting: Family Medicine

## 2022-03-22 VITALS — BP 118/78 | HR 74 | Temp 98.6°F | Wt 167.5 lb

## 2022-03-22 DIAGNOSIS — M5416 Radiculopathy, lumbar region: Secondary | ICD-10-CM

## 2022-03-22 DIAGNOSIS — Z0000 Encounter for general adult medical examination without abnormal findings: Secondary | ICD-10-CM

## 2022-03-22 NOTE — Progress Notes (Signed)
Subjective:    Patient ID: Theodore Schaefer, male    DOB: [birth year], 61 y.o.   MRN: [id number]  HPI Here for a well exam. His only complaint is the constant lower back pain that radiates down the left leg. We started him on Meloxicam, and this has helped with the pain in most of his joints. However the back pain persists.    Review of Systems  Constitutional: Negative.   HENT: Negative.    Eyes: Negative.   Respiratory: Negative.    Cardiovascular: Negative.   Gastrointestinal: Negative.   Genitourinary: Negative.   Musculoskeletal:  Positive for back pain.  Skin: Negative.   Neurological: Negative.   Psychiatric/Behavioral: Negative.         Objective:   Physical Exam Constitutional:      General: He is not in acute distress.    Appearance: Normal appearance. He is well-developed. He is not diaphoretic.  HENT:     Head: Normocephalic and atraumatic.     Right Ear: External ear normal.     Left Ear: External ear normal.     Nose: Nose normal.     Mouth/Throat:     Pharynx: No oropharyngeal exudate.  Eyes:     General: No scleral icterus.       Right eye: No discharge.        Left eye: No discharge.     Conjunctiva/sclera: Conjunctivae normal.     Pupils: Pupils are equal, round, and reactive to light.  Neck:     Thyroid: No thyromegaly.     Vascular: No JVD.     Trachea: No tracheal deviation.  Cardiovascular:     Rate and Rhythm: Normal rate and regular rhythm.     Heart sounds: Normal heart sounds. No murmur heard.    No friction rub. No gallop.  Pulmonary:     Effort: Pulmonary effort is normal. No respiratory distress.     Breath sounds: Normal breath sounds. No wheezing or rales.  Chest:     Chest wall: No tenderness.  Abdominal:     General: Bowel sounds are normal. There is no distension.     Palpations: Abdomen is soft. There is no mass.     Tenderness: There is no abdominal tenderness. There is no guarding or rebound.  Genitourinary:    Penis:  Normal. No tenderness.      Testes: Normal.     Prostate: Normal.     Rectum: Normal. Guaiac result negative.  Musculoskeletal:        General: No tenderness. Normal range of motion.     Cervical back: Neck supple.  Lymphadenopathy:     Cervical: No cervical adenopathy.  Skin:    General: Skin is warm and dry.     Coloration: Skin is not pale.     Findings: No erythema or rash.  Neurological:     Mental Status: He is alert and oriented to person, place, and time.     Cranial Nerves: No cranial nerve deficit.     Motor: No abnormal muscle tone.     Coordination: Coordination normal.     Deep Tendon Reflexes: Reflexes are normal and symmetric. Reflexes normal.  Psychiatric:        Behavior: Behavior normal.        Thought Content: Thought content normal.        Judgment: Judgment normal.           Assessment & Plan:  Well exam. We  discussed diet and exercise. Get fasting labs. We await the results of his lumbar spine MRI.  Gershon Crane, MD

## 2022-03-25 ENCOUNTER — Other Ambulatory Visit (INDEPENDENT_AMBULATORY_CARE_PROVIDER_SITE_OTHER): Payer: Commercial Managed Care - HMO

## 2022-03-25 DIAGNOSIS — Z0000 Encounter for general adult medical examination without abnormal findings: Secondary | ICD-10-CM | POA: Diagnosis not present

## 2022-03-25 LAB — PSA: PSA: 1.37 ng/mL (ref 0.10–4.00)

## 2022-03-25 LAB — CBC WITH DIFFERENTIAL/PLATELET
Basophils Absolute: 0 10*3/uL (ref 0.0–0.1)
Basophils Relative: 0.5 % (ref 0.0–3.0)
Eosinophils Absolute: 0.1 10*3/uL (ref 0.0–0.7)
Eosinophils Relative: 1.4 % (ref 0.0–5.0)
HCT: 37.1 % — ABNORMAL LOW (ref 39.0–52.0)
Hemoglobin: 11.8 g/dL — ABNORMAL LOW (ref 13.0–17.0)
Lymphocytes Relative: 19 % (ref 12.0–46.0)
Lymphs Abs: 1.6 10*3/uL (ref 0.7–4.0)
MCHC: 31.8 g/dL (ref 30.0–36.0)
MCV: 76.3 fl — ABNORMAL LOW (ref 78.0–100.0)
Monocytes Absolute: 0.6 10*3/uL (ref 0.1–1.0)
Monocytes Relative: 7.4 % (ref 3.0–12.0)
Neutro Abs: 5.9 10*3/uL (ref 1.4–7.7)
Neutrophils Relative %: 71.7 % (ref 43.0–77.0)
Platelets: 232 10*3/uL (ref 150.0–400.0)
RBC: 4.86 Mil/uL (ref 4.22–5.81)
RDW: 17.8 % — ABNORMAL HIGH (ref 11.5–15.5)
WBC: 8.2 10*3/uL (ref 4.0–10.5)

## 2022-03-25 LAB — HEPATIC FUNCTION PANEL
ALT: 10 U/L (ref 0–53)
AST: 12 U/L (ref 0–37)
Albumin: 4.1 g/dL (ref 3.5–5.2)
Alkaline Phosphatase: 80 U/L (ref 39–117)
Bilirubin, Direct: 0.1 mg/dL (ref 0.0–0.3)
Total Bilirubin: 0.7 mg/dL (ref 0.2–1.2)
Total Protein: 7 g/dL (ref 6.0–8.3)

## 2022-03-25 LAB — LIPID PANEL
Cholesterol: 204 mg/dL — ABNORMAL HIGH (ref 0–200)
HDL: 48.2 mg/dL (ref >39.00)
LDL Cholesterol: 134 mg/dL — ABNORMAL HIGH (ref 0–99)
NonHDL: 155.65
Total CHOL/HDL Ratio: 4
Triglycerides: 110 mg/dL (ref 0.0–149.0)
VLDL: 22 mg/dL (ref 0.0–40.0)

## 2022-03-25 LAB — BASIC METABOLIC PANEL
BUN: 14 mg/dL (ref 6–23)
CO2: 28 mEq/L (ref 19–32)
Calcium: 9.1 mg/dL (ref 8.4–10.5)
Chloride: 108 mEq/L (ref 96–112)
Creatinine, Ser: 1.07 mg/dL (ref 0.40–1.50)
GFR: 75.19 mL/min (ref >60.00)
Glucose, Bld: 97 mg/dL (ref 70–99)
Potassium: 3.9 mEq/L (ref 3.5–5.1)
Sodium: 139 mEq/L (ref 135–145)

## 2022-03-25 LAB — TSH: TSH: 2.59 u[IU]/mL (ref 0.35–5.50)

## 2022-03-25 LAB — HEMOGLOBIN A1C: Hgb A1c MFr Bld: 6 % (ref 4.6–6.5)

## 2022-03-27 MED ORDER — IRON (FERROUS SULFATE) 325 (65 FE) MG PO TABS: 325.0000 mg | ORAL_TABLET | Freq: Every day | ORAL | 3 refills | Status: AC

## 2022-04-04 ENCOUNTER — Telehealth: Payer: Self-pay

## 2022-04-04 NOTE — Telephone Encounter (Signed)
Called pt left a message for pt to call the office regarding MRI denial by his insurance and recommendation from Dr Clent Ridges

## 2022-04-05 NOTE — Telephone Encounter (Signed)
Pt returned call, requests call back

## 2022-04-09 ENCOUNTER — Other Ambulatory Visit: Payer: Commercial Managed Care - HMO
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
## 2022-04-23 ENCOUNTER — Telehealth: Payer: Self-pay | Admitting: Family Medicine

## 2022-04-23 NOTE — Telephone Encounter (Signed)
Pt requesting referral for MRI of back. Previous referral  [phone]

## 2022-04-23 NOTE — Telephone Encounter (Signed)
Returned pt call, advised that his insurance denied coverage of the MRI ordered by Dr Clent Ridges, advised to call medicaid for advise

## 2022-06-05 ENCOUNTER — Encounter: Payer: Self-pay | Admitting: Family Medicine

## 2022-06-05 ENCOUNTER — Telehealth: Payer: Self-pay | Admitting: Family Medicine

## 2022-06-05 NOTE — Telephone Encounter (Signed)
Disregard

## 2022-06-05 NOTE — Telephone Encounter (Signed)
Pt was referred out for a MRI however the place he went to doesn't take Medicaid. Pt was wondering if he can go to [facility] to get the MRI.   Please advise.

## 2022-06-06 NOTE — Telephone Encounter (Signed)
Pt was given a work excuse note yesterday per Dr Sarajane Jews, advised to call [facility] to see if they take Medicaid

## 2022-07-05 ENCOUNTER — Encounter (HOSPITAL_BASED_OUTPATIENT_CLINIC_OR_DEPARTMENT_OTHER): Payer: Self-pay

## 2022-07-05 ENCOUNTER — Emergency Department (HOSPITAL_BASED_OUTPATIENT_CLINIC_OR_DEPARTMENT_OTHER)
Admission: EM | Admit: 2022-07-05 | Discharge: 2022-07-05 | Discharge status: Against Medical Advice | Payer: Commercial Managed Care - HMO | Attending: Emergency Medicine | Admitting: Emergency Medicine

## 2022-07-05 ENCOUNTER — Other Ambulatory Visit: Payer: Self-pay

## 2022-07-05 DIAGNOSIS — Z5321 Procedure and treatment not carried out due to patient leaving prior to being seen by health care provider: Secondary | ICD-10-CM | POA: Diagnosis not present

## 2022-07-05 DIAGNOSIS — M545 Low back pain, unspecified: Secondary | ICD-10-CM | POA: Insufficient documentation

## 2022-07-05 NOTE — ED Triage Notes (Signed)
Pt presents with c/o lower back pain radiating down both legs. Reports seeing Dr. Shawnie Pons and referred for MRI but can't find a place that accepts his insurance.

## 2022-07-08 ENCOUNTER — Encounter (HOSPITAL_COMMUNITY): Payer: Self-pay

## 2022-07-08 ENCOUNTER — Other Ambulatory Visit: Payer: Self-pay | Admitting: Family Medicine

## 2022-07-08 ENCOUNTER — Ambulatory Visit (HOSPITAL_COMMUNITY)
Admission: EM | Admit: 2022-07-08 | Discharge: 2022-07-08 | Disposition: A | Discharge status: Home / Self Care | Payer: Commercial Managed Care - HMO | Attending: Emergency Medicine | Admitting: Emergency Medicine

## 2022-07-08 DIAGNOSIS — G8929 Other chronic pain: Secondary | ICD-10-CM

## 2022-07-08 DIAGNOSIS — M5442 Lumbago with sciatica, left side: Secondary | ICD-10-CM | POA: Diagnosis not present

## 2022-07-08 DIAGNOSIS — M5441 Lumbago with sciatica, right side: Secondary | ICD-10-CM | POA: Diagnosis not present

## 2022-07-08 LAB — POCT URINALYSIS DIPSTICK, ED / UC
Bilirubin Urine: NEGATIVE
Glucose, UA: NEGATIVE mg/dL
Nitrite: NEGATIVE
Protein, ur: NEGATIVE mg/dL
Specific Gravity, Urine: 1.02 (ref 1.005–1.030)
Urobilinogen, UA: 0.2 mg/dL (ref 0.0–1.0)
pH: 6.5 (ref 5.0–8.0)

## 2022-07-08 MED ORDER — CYCLOBENZAPRINE HCL 10 MG PO TABS: 10.0000 mg | ORAL_TABLET | Freq: Two times a day (BID) | ORAL | 0 refills | Status: AC | PRN

## 2022-07-08 MED ORDER — DEXAMETHASONE SODIUM PHOSPHATE 10 MG/ML IJ SOLN
INTRAMUSCULAR | Status: AC
  Filled 2022-07-08: qty 1

## 2022-07-08 MED ORDER — PREDNISONE 20 MG PO TABS: 40.0000 mg | ORAL_TABLET | Freq: Every day | ORAL | 0 refills | Status: DC

## 2022-07-08 MED ORDER — DEXAMETHASONE SODIUM PHOSPHATE 10 MG/ML IJ SOLN
10.0000 mg | Freq: Once | INTRAMUSCULAR | Status: AC
  Administered 2022-07-08: 10 mg via INTRAMUSCULAR

## 2022-07-08 MED ORDER — TIZANIDINE HCL 4 MG PO TABS: 4.0000 mg | ORAL_TABLET | Freq: Four times a day (QID) | ORAL | 0 refills | Status: DC | PRN

## 2022-07-08 NOTE — ED Triage Notes (Signed)
Pt c/o lower back pain radiating down both legs since Wednesday. States got to a point he couldn't lift his legs to walk. States taking meloxicam with little relief.

## 2022-07-08 NOTE — ED Provider Notes (Signed)
[facility]    CSN: [id number] Arrival date & time: 07/08/22  1007      History   Chief Complaint Chief Complaint  Patient presents with   Back Pain    HPI Theodore Schaefer is a 61 y.o. male. Patient presents complaining of chronic lower back pain with sciatica that has been ongoing.  Patient denies any recent fall or trauma to back.  Patient reports that he has had episodes of weakness that has caused difficulty with ambulation.  Patient reports over the past few days his symptoms have progressively worsened.  Patient denies any numbness or tingling in lower extremity. Patient denies any incontinence of bowel or bladder.  Patient reports urinary frequency. Patient denies any flank pain, hematuria, or dysuria. Patient reports that he seen his family medicine doctor for these ongoing symptoms and a MRI has been ordered, but he has been unable to have this done due to issues with insurance covering the scan.  Patient has taken meloxicam with minimal relief of symptoms.  Patient's spouse is at bedside.    Back Pain   Past Medical History:  Diagnosis Date   Arthritis    Stroke [facility])     Patient Active Problem List   Diagnosis Date Noted   Dyslipidemia 09/30/2018   Anemia 08/26/2018   Tobacco use 08/26/2018   Elevated blood pressure reading 08/26/2018    Past Surgical History:  Procedure Laterality Date   ACHILLES TENDON REPAIR Left    as a teenager    COLONOSCOPY  10/13/2017   per Dr. Adela Lank, int hemorrhoids, no polyps, repeat 10 yrs    left ankle surgery         Home Medications    Prior to Admission medications   Medication Sig Start Date End Date Taking? Authorizing Provider  cyclobenzaprine (FLEXERIL) 10 MG tablet Take 1 tablet (10 mg total) by mouth 2 (two) times daily as needed for muscle spasms. 07/08/22  Yes Debby Freiberg, NP  predniSONE (DELTASONE) 20 MG tablet Take 2 tablets (40 mg total) by mouth daily. 07/08/22  Yes Debby Freiberg, NP  aspirin EC 81 MG tablet Take 1 tablet (81 mg total) by mouth daily. Swallow whole. 03/04/22   Nelwyn Salisbury, MD  Iron, Ferrous Sulfate, 325 (65 Fe) MG TABS Take 325 mg by mouth daily. 03/27/22   Nelwyn Salisbury, MD  meloxicam (MOBIC) 15 MG tablet Take 1 tablet (15 mg total) by mouth daily. 03/04/22   Nelwyn Salisbury, MD  sildenafil (VIAGRA) 100 MG tablet Take 1 tablet (100 mg total) by mouth as needed for erectile dysfunction. 03/04/22   Nelwyn Salisbury, MD    Family History Family History  Problem Relation Age of Onset   Cancer Mother    Heart disease Father    Hypertension Father    Heart disease Sister    Heart disease Brother    Hypertension Brother    Colon cancer Neg Hx    Colon polyps Neg Hx    Esophageal cancer Neg Hx    Rectal cancer Neg Hx    Stomach cancer Neg Hx     Social History Social History   Tobacco Use   Smoking status: Some Days    Packs/day: 0.50    Types: Cigarettes   Smokeless tobacco: Never   Tobacco comments:    5 cigarettes daily   Vaping Use   Vaping Use: Never used  Substance Use Topics   Alcohol use: Yes  Comment: 12 pack a beer a week   Drug use: No     Allergies   Patient has no known allergies.   Review of Systems Review of Systems Per HPI  Physical Exam Triage Vital Signs ED Triage Vitals  Enc Vitals Group     BP 07/08/22 1114 (!) 163/98     Pulse Rate 07/08/22 1114 69     Resp 07/08/22 1114 18     Temp 07/08/22 1114 98.1 F (36.7 C)     Temp Source 07/08/22 1114 Oral     SpO2 07/08/22 1114 98 %     Weight --      Height --      Head Circumference --      Peak Flow --      Pain Score 07/08/22 1115 8     Pain Loc --      Pain Edu? --      Excl. in GC? --    No data found.  Updated Vital Signs BP (!) 163/98 (BP Location: Left Arm)   Pulse 69   Temp 98.1 F (36.7 C) (Oral)   Resp 18   SpO2 98%      Physical Exam Vitals and nursing note reviewed.  Constitutional:      Appearance: Normal appearance.   Abdominal:     Tenderness: There is no right CVA tenderness or left CVA tenderness.  Musculoskeletal:     Cervical back: Normal.     Thoracic back: Normal.     Lumbar back: No swelling, edema, deformity, signs of trauma, lacerations, spasms, tenderness or bony tenderness. Decreased range of motion. Positive right straight leg raise test and positive left straight leg raise test. No scoliosis.  Neurological:     Mental Status: He is alert.      UC Treatments / Results  Labs (all labs ordered are listed, but only abnormal results are displayed) Labs Reviewed  POCT URINALYSIS DIPSTICK, ED / UC - Abnormal; Notable for the following components:      Result Value   Ketones, ur TRACE (*)    Hgb urine dipstick TRACE (*)    Leukocytes,Ua TRACE (*)    All other components within normal limits    EKG   Radiology No results found.  Procedures Procedures (including critical care time)  Medications Ordered in UC Medications  dexamethasone (DECADRON) injection 10 mg (10 mg Intramuscular Given 07/08/22 1210)    Initial Impression / Assessment and Plan / UC Course  I have reviewed the triage vital signs and the nursing notes.  Pertinent labs & imaging results that were available during my care of the patient were reviewed by me and considered in my medical decision making (see chart for details).     Patient was evaluated for chronic back pain. Based on symptomology and chronicity of patient complaint, it seems symptoms are related to chronic back pain with associated sciatica. Patient was given a steroid injection in office. Patient was sent a prescription for Flexeril and Prednisone.  Shared decision-making was used to start Flexeril, patient was offered Zanaflex and declined due to using this medication in the past and not having enough pain management.  Patient was made aware of medication regiment of steroid and muscle relaxant.  Patient was made aware of possible side effects and  safety precautions with muscle relaxants.  Patient was made aware that he can continue taking the meloxicam previously prescribed by his PCP . Urinalysis was collected due to patient reporting  urinary frequency. Urinalysis showed ketones, trace blood, and trace leukocyte otherwise unremarkable. Low suspicion of bacterial etiology.  X-ray report present in epic since symptoms began, last lumbar x-ray showed degenerative changes on February 22, 2022.  No new trauma or indications warranting another lumbar x-ray.  Patient was made aware that he should call his insurance company to determine the best way to have MRI covered, he was also given him information for [facility] and told that he should follow-up with this office.  Patient verbalized understanding of instructions.   Charting was provided using a a verbal dictation system, charting was proofread for errors, errors may occur which could change the meaning of the information charted.   Final Clinical Impressions(s) / UC Diagnoses   Final diagnoses:  Chronic bilateral low back pain with bilateral sciatica     Discharge Instructions      Flexeril is a muscle relaxant that can help with pain, this can be take 2 time daily as needed. Please be mindful that the muscle relaxant can make you feel sleepy, please do not operate any heavy machinery or drive a car after taking this medication.  Prednisone is a steroid, this medication has been sent to the pharmacy, you will take 2 tablets for the next 5 mornings.  Please start taking the prednisone tomorrow since you are received a steroid injection in office today.   [facility] is an orthopedic specialist office, information has been attached to your discharge paperwork.   I encourage you to call the insurance company and determine the best approach to having the MRI ordered by your PCP covered.      ED Prescriptions     Medication Sig Dispense Auth. Provider   predniSONE (DELTASONE) 20 MG tablet  Take 2 tablets (40 mg total) by mouth daily. 10 tablet Debby Freiberg, NP   tiZANidine (ZANAFLEX) 4 MG tablet  (Status: Discontinued) Take 1 tablet (4 mg total) by mouth every 6 (six) hours as needed for muscle spasms. 30 tablet Debby Freiberg, NP   cyclobenzaprine (FLEXERIL) 10 MG tablet Take 1 tablet (10 mg total) by mouth 2 (two) times daily as needed for muscle spasms. 20 tablet Debby Freiberg, NP      PDMP not reviewed this encounter.   Debby Freiberg, NP 07/08/22 1739

## 2022-07-08 NOTE — Discharge Instructions (Addendum)
Flexeril is a muscle relaxant that can help with pain, this can be take 2 time daily as needed. Please be mindful that the muscle relaxant can make you feel sleepy, please do not operate any heavy machinery or drive a car after taking this medication.  Prednisone is a steroid, this medication has been sent to the pharmacy, you will take 2 tablets for the next 5 mornings.  Please start taking the prednisone tomorrow since you are received a steroid injection in office today.   [facility] is an orthopedic specialist office, information has been attached to your discharge paperwork.   I encourage you to call the insurance company and determine the best approach to having the MRI ordered by your PCP covered.

## 2022-07-23 ENCOUNTER — Encounter: Payer: Self-pay | Admitting: Family Medicine

## 2022-07-23 ENCOUNTER — Ambulatory Visit (INDEPENDENT_AMBULATORY_CARE_PROVIDER_SITE_OTHER): Payer: Commercial Managed Care - HMO | Admitting: Family Medicine

## 2022-07-23 VITALS — BP 142/80 | HR 99 | Temp 98.4°F | Wt 171.1 lb

## 2022-07-23 DIAGNOSIS — M5416 Radiculopathy, lumbar region: Secondary | ICD-10-CM

## 2022-07-23 DIAGNOSIS — N390 Urinary tract infection, site not specified: Secondary | ICD-10-CM | POA: Diagnosis not present

## 2022-07-23 LAB — POC URINALSYSI DIPSTICK (AUTOMATED)
Bilirubin, UA: NEGATIVE
Clarity, UA: NEGATIVE
Color, UA: NEGATIVE
Glucose, UA: NEGATIVE
Ketones, UA: NEGATIVE
Leukocytes, UA: NEGATIVE
Nitrite, UA: NEGATIVE
Protein, UA: POSITIVE — AB
Spec Grav, UA: 1.02 (ref 1.010–1.025)
Urobilinogen, UA: 0.2 E.U./dL
pH, UA: 5 (ref 5.0–8.0)

## 2022-07-23 MED ORDER — DOXYCYCLINE HYCLATE 100 MG PO TABS: 100.0000 mg | ORAL_TABLET | Freq: Two times a day (BID) | ORAL | 0 refills | Status: DC

## 2022-07-23 NOTE — Progress Notes (Signed)
   Subjective:    Patient ID: Theodore Schaefer, male    DOB: 12-07-1960, 61 y.o.   MRN: [id number]  HPI Here to follow up on an ED visit on 07-08-22 for worsening low back pain. He was treated with Flexeril and Prednisone 80 mg a day for 8 days. We had evaluated this on 03-22-22, and we had ordered a lumbar spine MRI. However it took a ling time to get this approved by his insurance company. He is now scheduled to get this scan on 07-29-22. The low back pain botheres him every day. The pain can radiate down both legs. He also has some weakness in both legs. Several days ago he fell to the ground because his legs "gave out". Currently he is taking nothing for the pain. In addition he says his urine has had a foul odor for the past month. He has urgency to urinate with occasional leakage of urine. No burning or fever.    Review of Systems  Constitutional: Negative.   Respiratory: Negative.    Cardiovascular: Negative.   Genitourinary:  Positive for frequency and urgency. Negative for dysuria, flank pain and hematuria.  Musculoskeletal:  Positive for back pain.       Objective:   Physical Exam Constitutional:      General: He is not in acute distress.    Appearance: Normal appearance.  Cardiovascular:     Rate and Rhythm: Normal rate and regular rhythm.     Pulses: Normal pulses.     Heart sounds: Normal heart sounds.  Pulmonary:     Effort: Pulmonary effort is normal.     Breath sounds: Normal breath sounds.  Abdominal:     Tenderness: There is no right CVA tenderness or left CVA tenderness.  Neurological:     Mental Status: He is alert.           Assessment & Plan:  He continues to have severe low back pain with radiculopathy. We await the results of the MRI next week. He also has a UTI, so we will treat this with 10 days of Doxycycline. We will also culture the sample.  Gershon Crane, MD

## 2022-07-23 NOTE — Addendum Note (Signed)
Addended by: Carola Rhine on: 07/23/2022 04:43 PM   Modules accepted: Orders

## 2022-07-24 LAB — URINE CULTURE
MICRO NUMBER:: 14303476
Result:: NO GROWTH
SPECIMEN QUALITY:: ADEQUATE

## 2022-08-26 ENCOUNTER — Telehealth: Payer: Self-pay | Admitting: Family Medicine

## 2022-08-26 NOTE — Telephone Encounter (Signed)
Tried to call pt and advise that form is ready for pick up but no answer. Please advise pt if return call. Unable to lvm

## 2022-08-26 NOTE — Telephone Encounter (Signed)
Form collected from front office folder and placed in providers folder.

## 2022-08-26 NOTE — Telephone Encounter (Signed)
Pt is asking that you call him at this number [phone]

## 2022-08-26 NOTE — Telephone Encounter (Signed)
The form is ready  

## 2022-08-26 NOTE — Telephone Encounter (Signed)
Patient stopped by Friday right before close to drop off plasma form needing to be signed. Charge form was completed and it was placed in folder for completion.     Please advise

## 2022-08-26 NOTE — Telephone Encounter (Signed)
PPW placed in front office filing cabinet.

## 2022-08-26 NOTE — Telephone Encounter (Signed)
Pt will pick up form

## 2022-09-20 ENCOUNTER — Ambulatory Visit: Payer: Commercial Managed Care - HMO | Admitting: Family Medicine

## 2022-09-23 ENCOUNTER — Ambulatory Visit: Payer: Commercial Managed Care - HMO | Admitting: Family Medicine

## 2022-10-30 ENCOUNTER — Ambulatory Visit: Payer: Commercial Managed Care - HMO | Admitting: Family Medicine

## 2022-10-31 ENCOUNTER — Ambulatory Visit: Payer: Commercial Managed Care - HMO | Admitting: Family Medicine

## 2022-11-01 ENCOUNTER — Ambulatory Visit: Payer: Commercial Managed Care - HMO | Admitting: Family Medicine

## 2022-12-03 ENCOUNTER — Ambulatory Visit: Payer: 59 | Admitting: Family Medicine

## 2022-12-04 ENCOUNTER — Ambulatory Visit: Payer: Commercial Managed Care - HMO | Admitting: Family Medicine

## 2022-12-04 ENCOUNTER — Ambulatory Visit: Payer: 59 | Admitting: Family Medicine

## 2022-12-09 ENCOUNTER — Encounter: Payer: Self-pay | Admitting: Family Medicine

## 2022-12-09 ENCOUNTER — Ambulatory Visit (INDEPENDENT_AMBULATORY_CARE_PROVIDER_SITE_OTHER): Payer: 59 | Admitting: Family Medicine

## 2022-12-09 VITALS — BP 110/74 | HR 75 | Temp 97.6°F | Wt 150.4 lb

## 2022-12-09 DIAGNOSIS — M5416 Radiculopathy, lumbar region: Secondary | ICD-10-CM

## 2022-12-09 MED ORDER — MELOXICAM 15 MG PO TABS: 15.0000 mg | ORAL_TABLET | Freq: Every day | ORAL | 3 refills | Status: DC

## 2022-12-09 NOTE — Progress Notes (Signed)
   Subjective:    Patient ID: Theodore Schaefer, male    DOB: 1961-01-02, 62 y.o.   MRN: [id number]  HPI Here to follow up on low back pain that radiates into both legs. He has been unable to work since 08-07-22 due to this pain. He has applies for disability and has retained an attorney. He takes a Meloxicam every day. He says the pain has gotten a little worse over time. We had ordered a lumbar MRI scan, but his insurance refused to pay for it.    Review of Systems  Constitutional: Negative.   Respiratory: Negative.    Cardiovascular: Negative.   Musculoskeletal:  Positive for back pain.       Objective:   Physical Exam Constitutional:      Comments: Walks slowly with pain   Cardiovascular:     Rate and Rhythm: Normal rate and regular rhythm.     Pulses: Normal pulses.     Heart sounds: Normal heart sounds.  Pulmonary:     Effort: Pulmonary effort is normal.     Breath sounds: Normal breath sounds.  Musculoskeletal:     Comments: Tender on both sides of the lower back. ROM is decreased due to pain   Neurological:     Mental Status: He is alert.           Assessment & Plan:  Chronic low back pain with radiculopathy. He will continue to take Meloxicam daily, and he can add up to 3000 mg of Tylenol daily to this. We will refer him to Orthopedics to evaluate.  Gershon Crane, MD

## 2023-01-16 ENCOUNTER — Ambulatory Visit: Payer: 59 | Admitting: Family Medicine

## 2023-03-17 ENCOUNTER — Ambulatory Visit: Payer: 59 | Admitting: Family Medicine

## 2023-04-23 ENCOUNTER — Ambulatory Visit: Payer: 59 | Admitting: Family Medicine

## 2023-04-24 ENCOUNTER — Ambulatory Visit (INDEPENDENT_AMBULATORY_CARE_PROVIDER_SITE_OTHER): Payer: 59 | Admitting: Family Medicine

## 2023-04-24 ENCOUNTER — Encounter: Payer: Self-pay | Admitting: Family Medicine

## 2023-04-24 VITALS — BP 120/78 | HR 96 | Temp 97.9°F | Wt 151.6 lb

## 2023-04-24 DIAGNOSIS — G5601 Carpal tunnel syndrome, right upper limb: Secondary | ICD-10-CM | POA: Diagnosis not present

## 2023-04-24 MED ORDER — CELECOXIB 200 MG PO CAPS: 200.0000 mg | ORAL_CAPSULE | Freq: Two times a day (BID) | ORAL | 5 refills | Status: AC

## 2023-04-24 MED ORDER — SILDENAFIL CITRATE 100 MG PO TABS: 100.0000 mg | ORAL_TABLET | ORAL | 11 refills | Status: AC | PRN

## 2023-04-24 NOTE — Progress Notes (Signed)
   Subjective:    Patient ID: Theodore Schaefer, male    DOB: [birth year], 62 y.o.   MRN: [id number]  HPI Here for one month of numbness and tingling in the right arm and right hand. No pain or weakness. No neck pain.    Review of Systems  Constitutional: Negative.   Respiratory: Negative.    Cardiovascular: Negative.   Musculoskeletal:  Negative for arthralgias.  Neurological:  Positive for numbness. Negative for weakness.       Objective:   Physical Exam Constitutional:      Appearance: Normal appearance.  Cardiovascular:     Rate and Rhythm: Normal rate and regular rhythm.     Pulses: Normal pulses.     Heart sounds: Normal heart sounds.  Pulmonary:     Effort: Pulmonary effort is normal.     Breath sounds: Normal breath sounds.  Musculoskeletal:     Comments: Right hand is normal on exam. Skin is warm. No swelling or tenderness   Neurological:     Mental Status: He is alert.           Assessment & Plan:  Probable carpal tunnel syndrome. He can wear a wrist splint. Set up a nerve conduction study sometime soon.  Gershon Crane, MD
